# Patient Record
Sex: Female | Born: 1980 | Race: White | Hispanic: No | Marital: Single | Smoking: Former smoker
Health system: Southern US, Community
[De-identification: ages and names within clinical notes are randomized; demographics above are authoritative.]

## PROBLEM LIST (undated history)

## (undated) DIAGNOSIS — C8590 Non-Hodgkin lymphoma, unspecified, unspecified site: Secondary | ICD-10-CM

---
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
## 2000-06-12 ENCOUNTER — Inpatient Hospital Stay (HOSPITAL_COMMUNITY): Admission: AD | Admit: 2000-06-12 | Discharge: 2000-06-14 | Payer: Self-pay | Admitting: Obstetrics and Gynecology

## 2001-02-25 ENCOUNTER — Other Ambulatory Visit: Admission: RE | Admit: 2001-02-25 | Discharge: 2001-02-25 | Payer: Self-pay | Admitting: Obstetrics and Gynecology

## 2002-10-21 ENCOUNTER — Other Ambulatory Visit: Admission: RE | Admit: 2002-10-21 | Discharge: 2002-10-21 | Payer: Self-pay | Admitting: Obstetrics and Gynecology

## 2002-10-22 ENCOUNTER — Other Ambulatory Visit: Admission: RE | Admit: 2002-10-22 | Discharge: 2002-10-22 | Payer: Self-pay | Admitting: Obstetrics and Gynecology

## 2005-07-03 ENCOUNTER — Other Ambulatory Visit: Admission: RE | Admit: 2005-07-03 | Discharge: 2005-07-03 | Payer: Self-pay | Admitting: Obstetrics and Gynecology

## 2006-01-15 ENCOUNTER — Inpatient Hospital Stay (HOSPITAL_COMMUNITY): Admission: AD | Admit: 2006-01-15 | Discharge: 2006-01-17 | Payer: Self-pay | Admitting: Obstetrics and Gynecology

## 2006-04-02 ENCOUNTER — Ambulatory Visit (HOSPITAL_COMMUNITY): Admission: RE | Admit: 2006-04-02 | Discharge: 2006-04-02 | Payer: Self-pay | Admitting: Obstetrics & Gynecology

## 2006-04-02 ENCOUNTER — Encounter (INDEPENDENT_AMBULATORY_CARE_PROVIDER_SITE_OTHER): Payer: Self-pay | Admitting: Specialist

## 2006-04-05 ENCOUNTER — Ambulatory Visit (HOSPITAL_COMMUNITY): Admission: RE | Admit: 2006-04-05 | Discharge: 2006-04-05 | Payer: Self-pay | Admitting: Obstetrics & Gynecology

## 2006-05-01 ENCOUNTER — Ambulatory Visit: Admission: RE | Admit: 2006-05-01 | Discharge: 2006-05-01 | Payer: Self-pay | Admitting: Gynecology

## 2006-05-28 ENCOUNTER — Inpatient Hospital Stay (HOSPITAL_COMMUNITY): Admission: RE | Admit: 2006-05-28 | Discharge: 2006-05-31 | Payer: Self-pay | Admitting: Obstetrics & Gynecology

## 2006-05-28 ENCOUNTER — Encounter: Payer: Self-pay | Admitting: Gynecology

## 2006-05-28 ENCOUNTER — Encounter (INDEPENDENT_AMBULATORY_CARE_PROVIDER_SITE_OTHER): Payer: Self-pay | Admitting: *Deleted

## 2006-05-30 ENCOUNTER — Ambulatory Visit: Payer: Self-pay | Admitting: Oncology

## 2006-05-31 ENCOUNTER — Ambulatory Visit: Payer: Self-pay | Admitting: Oncology

## 2006-06-04 ENCOUNTER — Encounter: Payer: Self-pay | Admitting: Oncology

## 2006-06-04 ENCOUNTER — Encounter (INDEPENDENT_AMBULATORY_CARE_PROVIDER_SITE_OTHER): Payer: Self-pay | Admitting: *Deleted

## 2006-06-04 ENCOUNTER — Other Ambulatory Visit: Admission: RE | Admit: 2006-06-04 | Discharge: 2006-06-04 | Payer: Self-pay | Admitting: Oncology

## 2006-06-04 LAB — CBC WITH DIFFERENTIAL/PLATELET
Eosinophils Absolute: 0.2 10*3/uL (ref 0.0–0.5)
LYMPH%: 19.4 % (ref 14.0–48.0)
MCH: 30.7 pg (ref 26.0–34.0)
MCHC: 34.5 g/dL (ref 32.0–36.0)
MCV: 89.1 fL (ref 81.0–101.0)
MONO#: 0.2 10*3/uL (ref 0.1–0.9)
MONO%: 3.7 % (ref 0.0–13.0)
NEUT%: 73.8 % (ref 39.6–76.8)
RBC: 4.42 10*6/uL (ref 3.70–5.32)
RDW: 12.4 % (ref 11.3–14.5)
lymph#: 1.2 10*3/uL (ref 0.9–3.3)

## 2006-06-04 LAB — COMPREHENSIVE METABOLIC PANEL
ALT: 12 U/L (ref 0–35)
AST: 10 U/L (ref 0–37)
Albumin: 4.7 g/dL (ref 3.5–5.2)
Creatinine, Ser: 0.55 mg/dL (ref 0.40–1.20)
Glucose, Bld: 98 mg/dL (ref 70–99)
Potassium: 4.2 mEq/L (ref 3.5–5.3)
Total Protein: 7.2 g/dL (ref 6.0–8.3)

## 2006-06-04 LAB — LACTATE DEHYDROGENASE: LDH: 138 U/L (ref 94–250)

## 2006-06-07 LAB — HEPATITIS B SURFACE ANTIGEN: Hepatitis B Surface Ag: NEGATIVE

## 2006-06-10 ENCOUNTER — Ambulatory Visit (HOSPITAL_COMMUNITY): Admission: RE | Admit: 2006-06-10 | Discharge: 2006-06-10 | Payer: Self-pay | Admitting: Oncology

## 2006-06-21 LAB — CBC WITH DIFFERENTIAL/PLATELET
EOS%: 0.9 % (ref 0.0–7.0)
HGB: 12.5 g/dL (ref 11.6–15.9)
MCH: 30.8 pg (ref 26.0–34.0)
MCHC: 36 g/dL (ref 32.0–36.0)
MCV: 85.3 fL (ref 81.0–101.0)
MONO#: 0 10*3/uL — ABNORMAL LOW (ref 0.1–0.9)
NEUT#: 2.7 10*3/uL (ref 1.5–6.5)
NEUT%: 63.5 % (ref 39.6–76.8)
lymph#: 1.5 10*3/uL (ref 0.9–3.3)

## 2006-06-27 ENCOUNTER — Encounter (INDEPENDENT_AMBULATORY_CARE_PROVIDER_SITE_OTHER): Payer: Self-pay | Admitting: Cardiology

## 2006-06-27 ENCOUNTER — Ambulatory Visit: Admission: RE | Admit: 2006-06-27 | Discharge: 2006-06-27 | Payer: Self-pay | Admitting: Oncology

## 2006-07-02 LAB — CBC WITH DIFFERENTIAL/PLATELET
HCT: 36.4 % (ref 34.8–46.6)
LYMPH%: 34.6 % (ref 14.0–48.0)
MCH: 30.8 pg (ref 26.0–34.0)
MCV: 84.6 fL (ref 81.0–101.0)
MONO#: 0.6 10*3/uL (ref 0.1–0.9)
NEUT#: 0.9 10*3/uL — ABNORMAL LOW (ref 1.5–6.5)
NEUT%: 38.7 % — ABNORMAL LOW (ref 39.6–76.8)
Platelets: 239 10*3/uL (ref 145–400)
RBC: 4.31 10*6/uL (ref 3.70–5.32)
WBC: 2.2 10*3/uL — ABNORMAL LOW (ref 3.9–10.0)
lymph#: 0.8 10*3/uL — ABNORMAL LOW (ref 0.9–3.3)

## 2006-07-02 LAB — TECHNOLOGIST REVIEW

## 2006-07-05 LAB — COMPREHENSIVE METABOLIC PANEL
ALT: 15 U/L (ref 0–35)
CO2: 26 mEq/L (ref 19–32)
Calcium: 8.9 mg/dL (ref 8.4–10.5)
Chloride: 107 mEq/L (ref 96–112)
Potassium: 3.8 mEq/L (ref 3.5–5.3)
Sodium: 138 mEq/L (ref 135–145)
Total Protein: 6.9 g/dL (ref 6.0–8.3)

## 2006-07-05 LAB — CBC WITH DIFFERENTIAL/PLATELET
BASO%: 0.9 % (ref 0.0–2.0)
HCT: 36.7 % (ref 34.8–46.6)
MCHC: 35.6 g/dL (ref 32.0–36.0)
MONO#: 0.4 10*3/uL (ref 0.1–0.9)
RBC: 4.31 10*6/uL (ref 3.70–5.32)
RDW: 11.7 % (ref 11.3–14.5)
WBC: 5.2 10*3/uL (ref 3.9–10.0)
lymph#: 1.4 10*3/uL (ref 0.9–3.3)

## 2006-07-09 ENCOUNTER — Ambulatory Visit: Admission: RE | Admit: 2006-07-09 | Discharge: 2006-07-09 | Payer: Self-pay | Admitting: Gynecology

## 2006-07-15 ENCOUNTER — Ambulatory Visit: Payer: Self-pay | Admitting: Oncology

## 2006-07-15 LAB — CBC WITH DIFFERENTIAL/PLATELET
BASO%: 0.9 % (ref 0.0–2.0)
EOS%: 0.7 % (ref 0.0–7.0)
LYMPH%: 13.7 % — ABNORMAL LOW (ref 14.0–48.0)
MCH: 30.5 pg (ref 26.0–34.0)
MCHC: 35.8 g/dL (ref 32.0–36.0)
MONO#: 0.2 10*3/uL (ref 0.1–0.9)
RBC: 3.86 10*6/uL (ref 3.70–5.32)
WBC: 3.6 10*3/uL — ABNORMAL LOW (ref 3.9–10.0)
lymph#: 0.5 10*3/uL — ABNORMAL LOW (ref 0.9–3.3)

## 2006-07-23 LAB — CBC WITH DIFFERENTIAL/PLATELET
BASO%: 0.8 % (ref 0.0–2.0)
EOS%: 1.1 % (ref 0.0–7.0)
HCT: 33.2 % — ABNORMAL LOW (ref 34.8–46.6)
MCHC: 36 g/dL (ref 32.0–36.0)
MONO#: 0.7 10*3/uL (ref 0.1–0.9)
NEUT%: 53.7 % (ref 39.6–76.8)
RDW: 18 % — ABNORMAL HIGH (ref 11.3–14.5)
WBC: 3.8 10*3/uL — ABNORMAL LOW (ref 3.9–10.0)
lymph#: 1 10*3/uL (ref 0.9–3.3)

## 2006-07-26 LAB — CBC WITH DIFFERENTIAL/PLATELET
Basophils Absolute: 0.1 10*3/uL (ref 0.0–0.1)
EOS%: 0.5 % (ref 0.0–7.0)
HCT: 35.5 % (ref 34.8–46.6)
HGB: 12.7 g/dL (ref 11.6–15.9)
LYMPH%: 17.8 % (ref 14.0–48.0)
MCH: 31 pg (ref 26.0–34.0)
MONO#: 0.7 10*3/uL (ref 0.1–0.9)
NEUT%: 68.6 % (ref 39.6–76.8)
Platelets: 281 10*3/uL (ref 145–400)
lymph#: 1.2 10*3/uL (ref 0.9–3.3)

## 2006-08-16 LAB — CBC WITH DIFFERENTIAL/PLATELET
BASO%: 0.5 % (ref 0.0–2.0)
Basophils Absolute: 0 10*3/uL (ref 0.0–0.1)
Eosinophils Absolute: 0 10*3/uL (ref 0.0–0.5)
HCT: 35.6 % (ref 34.8–46.6)
HGB: 12.8 g/dL (ref 11.6–15.9)
MONO#: 0.8 10*3/uL (ref 0.1–0.9)
NEUT#: 7.3 10*3/uL — ABNORMAL HIGH (ref 1.5–6.5)
NEUT%: 79 % — ABNORMAL HIGH (ref 39.6–76.8)
WBC: 9.2 10*3/uL (ref 3.9–10.0)
lymph#: 1 10*3/uL (ref 0.9–3.3)

## 2006-08-16 LAB — COMPREHENSIVE METABOLIC PANEL
BUN: 8 mg/dL (ref 6–23)
CO2: 26 mEq/L (ref 19–32)
Calcium: 9.4 mg/dL (ref 8.4–10.5)
Chloride: 106 mEq/L (ref 96–112)
Creatinine, Ser: 0.42 mg/dL (ref 0.40–1.20)
Total Bilirubin: 0.7 mg/dL (ref 0.3–1.2)

## 2006-08-26 ENCOUNTER — Ambulatory Visit (HOSPITAL_COMMUNITY): Admission: RE | Admit: 2006-08-26 | Discharge: 2006-08-26 | Payer: Self-pay | Admitting: Oncology

## 2006-08-29 ENCOUNTER — Ambulatory Visit (HOSPITAL_COMMUNITY): Admission: RE | Admit: 2006-08-29 | Discharge: 2006-08-29 | Payer: Self-pay | Admitting: Oncology

## 2006-08-29 ENCOUNTER — Ambulatory Visit: Payer: Self-pay | Admitting: Oncology

## 2006-08-31 LAB — PNEUMOCYSTIS JIROVECI SMEAR BY DFA

## 2006-09-06 LAB — COMPREHENSIVE METABOLIC PANEL
ALT: 16 U/L (ref 0–35)
AST: 21 U/L (ref 0–37)
Albumin: 3.8 g/dL (ref 3.5–5.2)
Alkaline Phosphatase: 65 U/L (ref 39–117)
Calcium: 8.7 mg/dL (ref 8.4–10.5)
Chloride: 107 mEq/L (ref 96–112)
Potassium: 3.8 mEq/L (ref 3.5–5.3)
Sodium: 140 mEq/L (ref 135–145)

## 2006-09-06 LAB — CBC WITH DIFFERENTIAL/PLATELET
BASO%: 1.8 % (ref 0.0–2.0)
EOS%: 1.3 % (ref 0.0–7.0)
HGB: 11.8 g/dL (ref 11.6–15.9)
MCH: 31.2 pg (ref 26.0–34.0)
MCHC: 34.5 g/dL (ref 32.0–36.0)
MCV: 90.5 fL (ref 81.0–101.0)
MONO%: 14 % — ABNORMAL HIGH (ref 0.0–13.0)
RBC: 3.77 10*6/uL (ref 3.70–5.32)
RDW: 14.1 % (ref 11.3–14.5)
lymph#: 0.8 10*3/uL — ABNORMAL LOW (ref 0.9–3.3)

## 2006-09-27 LAB — COMPREHENSIVE METABOLIC PANEL
ALT: 13 U/L (ref 0–35)
AST: 14 U/L (ref 0–37)
Alkaline Phosphatase: 66 U/L (ref 39–117)
BUN: 12 mg/dL (ref 6–23)
Chloride: 104 mEq/L (ref 96–112)
Creatinine, Ser: 0.51 mg/dL (ref 0.40–1.20)

## 2006-09-27 LAB — CBC WITH DIFFERENTIAL/PLATELET
BASO%: 1.2 % (ref 0.0–2.0)
Basophils Absolute: 0.1 10*3/uL (ref 0.0–0.1)
EOS%: 1.1 % (ref 0.0–7.0)
HCT: 34.4 % — ABNORMAL LOW (ref 34.8–46.6)
HGB: 12.6 g/dL (ref 11.6–15.9)
MCH: 32 pg (ref 26.0–34.0)
MCHC: 36.5 g/dL — ABNORMAL HIGH (ref 32.0–36.0)
MCV: 87.7 fL (ref 81.0–101.0)
MONO%: 14.7 % — ABNORMAL HIGH (ref 0.0–13.0)
NEUT%: 66.5 % (ref 39.6–76.8)
RDW: 12.2 % (ref 11.3–14.5)
lymph#: 0.7 10*3/uL — ABNORMAL LOW (ref 0.9–3.3)

## 2006-10-21 ENCOUNTER — Ambulatory Visit (HOSPITAL_COMMUNITY): Admission: RE | Admit: 2006-10-21 | Discharge: 2006-10-21 | Payer: Self-pay | Admitting: Oncology

## 2006-10-22 ENCOUNTER — Ambulatory Visit: Payer: Self-pay | Admitting: Oncology

## 2006-10-24 LAB — CBC WITH DIFFERENTIAL/PLATELET
BASO%: 0.7 % (ref 0.0–2.0)
Basophils Absolute: 0.1 10*3/uL (ref 0.0–0.1)
EOS%: 0.9 % (ref 0.0–7.0)
HCT: 36.5 % (ref 34.8–46.6)
HGB: 13.3 g/dL (ref 11.6–15.9)
LYMPH%: 9.3 % — ABNORMAL LOW (ref 14.0–48.0)
MCH: 31.8 pg (ref 26.0–34.0)
MCHC: 36.5 g/dL — ABNORMAL HIGH (ref 32.0–36.0)
MCV: 87.1 fL (ref 81.0–101.0)
MONO%: 9.1 % (ref 0.0–13.0)
NEUT%: 80 % — ABNORMAL HIGH (ref 39.6–76.8)
Platelets: 239 10*3/uL (ref 145–400)

## 2006-11-21 LAB — CBC WITH DIFFERENTIAL/PLATELET
Basophils Absolute: 0 10*3/uL (ref 0.0–0.1)
EOS%: 2.4 % (ref 0.0–7.0)
HCT: 37.1 % (ref 34.8–46.6)
HGB: 13.5 g/dL (ref 11.6–15.9)
MONO#: 0.5 10*3/uL (ref 0.1–0.9)
NEUT#: 3.5 10*3/uL (ref 1.5–6.5)
NEUT%: 73.1 % (ref 39.6–76.8)
RDW: 11.2 % — ABNORMAL LOW (ref 11.3–14.5)
WBC: 4.8 10*3/uL (ref 3.9–10.0)
lymph#: 0.7 10*3/uL — ABNORMAL LOW (ref 0.9–3.3)

## 2007-01-15 ENCOUNTER — Ambulatory Visit (HOSPITAL_COMMUNITY): Admission: RE | Admit: 2007-01-15 | Discharge: 2007-01-15 | Payer: Self-pay | Admitting: Oncology

## 2007-01-28 ENCOUNTER — Ambulatory Visit: Payer: Self-pay | Admitting: Oncology

## 2007-04-16 ENCOUNTER — Ambulatory Visit: Payer: Self-pay | Admitting: Oncology

## 2007-05-30 ENCOUNTER — Ambulatory Visit: Payer: Self-pay | Admitting: Oncology

## 2007-06-03 LAB — CBC WITH DIFFERENTIAL/PLATELET
Basophils Absolute: 0.1 10*3/uL (ref 0.0–0.1)
EOS%: 1.2 % (ref 0.0–7.0)
Eosinophils Absolute: 0.1 10*3/uL (ref 0.0–0.5)
HCT: 37.9 % (ref 34.8–46.6)
HGB: 13.7 g/dL (ref 11.6–15.9)
MCH: 31.4 pg (ref 26.0–34.0)
MCV: 86.8 fL (ref 81.0–101.0)
MONO%: 6.8 % (ref 0.0–13.0)
NEUT#: 5 10*3/uL (ref 1.5–6.5)
NEUT%: 77 % — ABNORMAL HIGH (ref 39.6–76.8)
RDW: 10.7 % — ABNORMAL LOW (ref 11.3–14.5)
lymph#: 0.9 10*3/uL (ref 0.9–3.3)

## 2007-09-30 ENCOUNTER — Ambulatory Visit: Payer: Self-pay | Admitting: Oncology

## 2008-01-27 ENCOUNTER — Ambulatory Visit: Payer: Self-pay | Admitting: Oncology

## 2008-06-01 IMAGING — CT NM PET TUM IMG SKULL BASE T - THIGH
5 series · 25 of 25 positions shown · IV contrast (REDICAT)
Comparison: No prior PET studies for comparison.  Comparison is made to CT of the chest and abdomen on 05/29/06.

CLINICAL DATA: New diagnosis of diffuse large B cell lymphoma. The diagnosis was made from resection of a right pelvic retroperitoneal mass.  Staging CT has also shown evidence of right-sided axillary and subpectoral adenopathy as well as a 1.2 cm left adrenal nodule, splenomegaly, and soft tissue thickening along the medial wall of the cecum.  
FDG PET-CT TUMOR IMAGING (SKULL BASE TO THIGHS) ? 06/10/06: 
Fasting Blood Glucose:  89.
TECHNIQUE: 15.6 mCi F18-FDG were administered via the left antecubital fossa.  Full ring PET imaging was performed from the skull base through the mid-thighs 60   minutes after injection.  CT data was obtained and used for attenuation correction and anatomic localization only.  (This was not acquired as a diagnostic CT examination.)

[Series 1: pet ac · axial · 3.3mm · 4.69mm/px · z∈[-734,-8]mm · 5 of 223 slices shown]
[im 1/223]
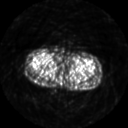
[im 56/223]
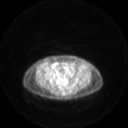
[im 112/223]
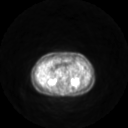
[im 167/223]
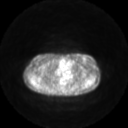
[im 223/223]
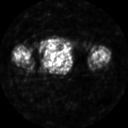

[Series 2: pet nac · axial · 3.3mm · 4.69mm/px · z∈[-734,-8]mm · 6 of 223 slices shown]
[im 1/223]
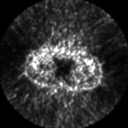
[im 45/223]
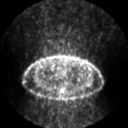
[im 89/223]
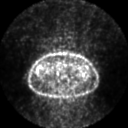
[im 134/223]
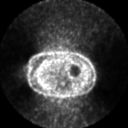
[im 178/223]
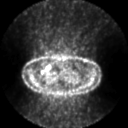
[im 223/223]
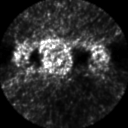

[Series 2: ct images · axial · 3.8mm · 0.98mm/px · z∈[-734,-8]mm · 6 of 223 slices shown]
[im 1/223]
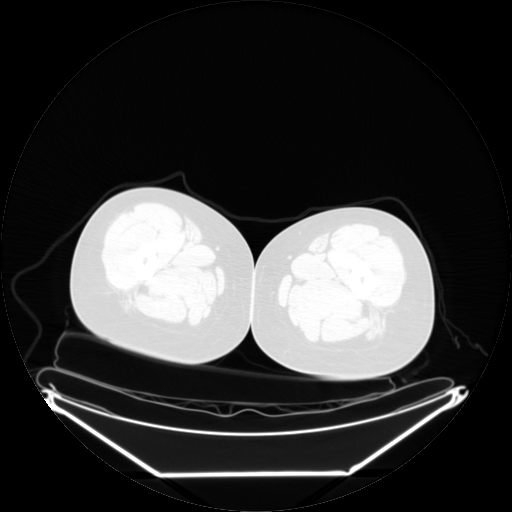
[im 45/223]
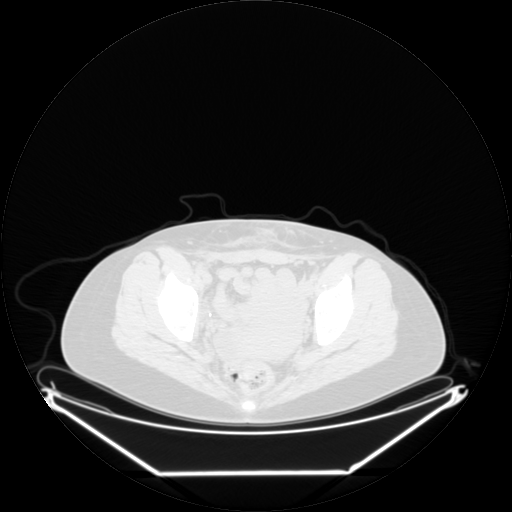
[im 89/223]
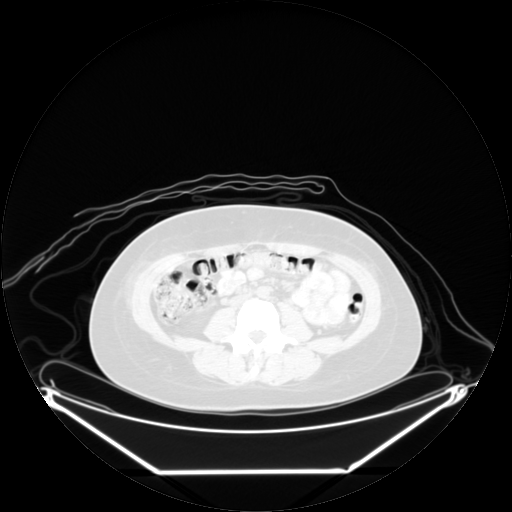
[im 134/223]
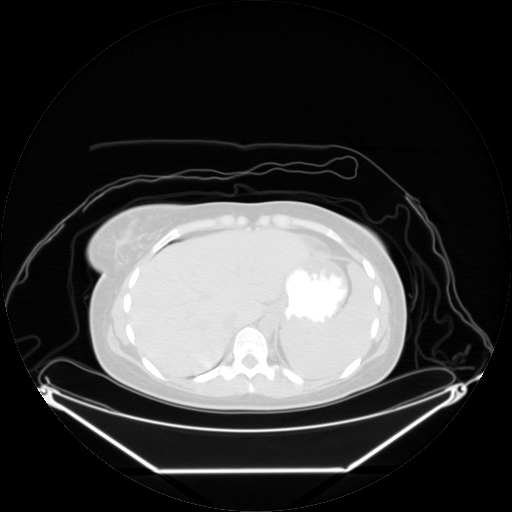
[im 178/223]
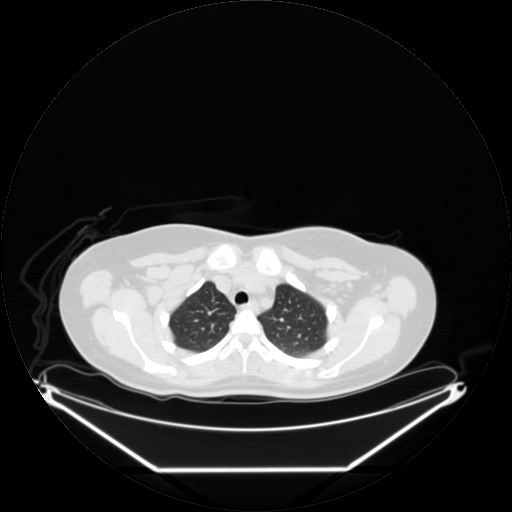
[im 223/223]
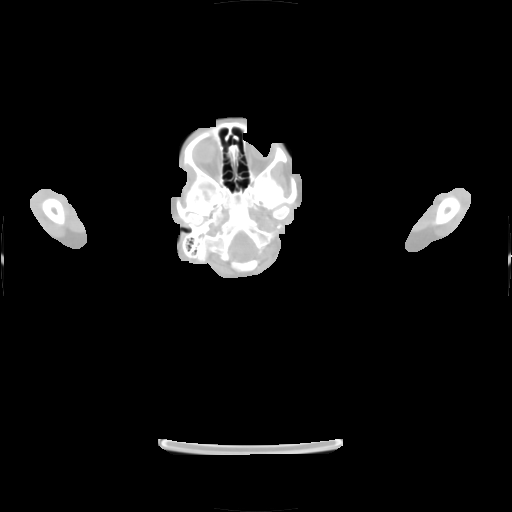

[Series 151: reformatted · axial · 3.3mm · 3.91mm/px · z∈[-731,-8]mm · 6 of 220 slices shown (1 of 2)]
[im 1/220]
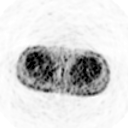
[im 44/220]
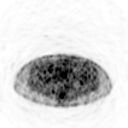
[im 88/220]
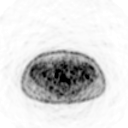
[im 132/220]
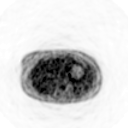
[im 176/220]
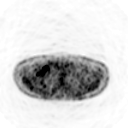
[im 220/220]
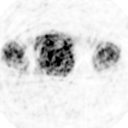

[Series 153: reformatted · coronal · 3.3mm · 5.83mm/px · 2 of 63 slices shown (2 of 2)]
[im 1/63]
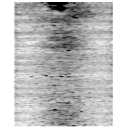
[im 63/63]
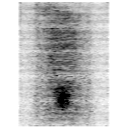

[25 of 25 positions shown; findings below may reference images not displayed]

FINDINGS: The large subpectoral node on the right shows abnormal metabolic activity with malignant-level SUV-max of 4.0.  Maximal transverse dimensions obtained on the attenuation correction CT images is 1.8 x 3 cm.  An adjacent more lateral subpectoral lymph node which is actually located in the high axilla measures 1.6 x 3.6 cm. This also demonstrates abnormal metabolic activity with an SUV-max of 3.8.  A tiny subpectoral lymph node and adjacent small axillary nodes on the left show no abnormal metabolic activity.  No other enlarged lymph nodes are identified in the chest.  No abnormal bone marrow activity.  No pleural fluid.  
There is nonspecific activity in the right tonsillar region and adjacent to the angle of the mandible on the right.  It is not accompanied by a definite soft tissue mass and likely relates to motion and muscle activity occurring just prior to the PET acquisition.  
At the level of the resected right-sided pelvic extraperitoneal tumor mass, clips are seen from prior surgical excision.  No abnormal metabolic activity is seen in this region.  No other lymph node activity is seen in the abdomen or pelvis.  There is a focal right adnexal cyst in the pelvis measuring approximately 3.4 cm and demonstrating no abnormal metabolic activity.  No bone marrow activity or focal bony lesions are identified.
IMPRESSION: The only definite abnormal lymph node activity present by PET is in the right axillary and subpectoral regions at the site of previously imaged enlarged lymph nodes.  There is no abnormal metabolic activity present in the right lateral pelvis at the site of resection of the large extraperitoneal soft tissue mass or of the left adrenal gland.  Activity in the region of the tonsils and mandible is present which is more prominent towards the right.  This is not the location of obviously enlarged lymph node tissue and it is felt unlikely that this represents lymphoma.

## 2009-06-11 ENCOUNTER — Ambulatory Visit: Payer: Self-pay | Admitting: Interventional Radiology

## 2009-06-11 ENCOUNTER — Emergency Department (HOSPITAL_BASED_OUTPATIENT_CLINIC_OR_DEPARTMENT_OTHER): Admission: EM | Admit: 2009-06-11 | Discharge: 2009-06-11 | Payer: Self-pay | Admitting: Emergency Medicine

## 2009-07-13 ENCOUNTER — Ambulatory Visit: Payer: Self-pay | Admitting: Oncology

## 2009-08-12 ENCOUNTER — Ambulatory Visit: Payer: Self-pay | Admitting: Oncology

## 2010-02-09 ENCOUNTER — Ambulatory Visit: Payer: Self-pay | Admitting: Oncology

## 2010-03-13 ENCOUNTER — Ambulatory Visit: Payer: Self-pay | Admitting: Oncology

## 2010-06-11 ENCOUNTER — Encounter: Payer: Self-pay | Admitting: Obstetrics & Gynecology

## 2010-08-07 LAB — DIFFERENTIAL
Eosinophils Absolute: 0.1 10*3/uL (ref 0.0–0.7)
Eosinophils Relative: 1 % (ref 0–5)
Lymphocytes Relative: 12 % (ref 12–46)
Monocytes Absolute: 0.6 10*3/uL (ref 0.1–1.0)
Neutro Abs: 7.9 10*3/uL — ABNORMAL HIGH (ref 1.7–7.7)
Neutrophils Relative %: 78 % — ABNORMAL HIGH (ref 43–77)

## 2010-08-07 LAB — CBC
HCT: 41.6 % (ref 36.0–46.0)
Hemoglobin: 14.3 g/dL (ref 12.0–15.0)
MCHC: 34.2 g/dL (ref 30.0–36.0)
MCV: 91.5 fL (ref 78.0–100.0)
Platelets: 213 10*3/uL (ref 150–400)
RDW: 12 % (ref 11.5–15.5)

## 2010-08-22 ENCOUNTER — Other Ambulatory Visit: Payer: Self-pay | Admitting: Obstetrics and Gynecology

## 2010-10-03 NOTE — Discharge Summary (Signed)
NAMEAYJAH, SHOW NO.:  [id number]   MEDICAL RECORD NO.:  [id number]          PATIENT TYPE:  INP   LOCATION:  1609                         FACILITY:  [facility]   PHYSICIAN:  Ilda Mori, M.D.   DATE OF BIRTH:  26-Nov-1980   DATE OF ADMISSION:  05/28/2006  DATE OF DISCHARGE:  05/31/2006                               DISCHARGE SUMMARY   FINAL DIAGNOSIS:  Large B-cell lymphoma in the right retroperitoneal  pelvic area.   SECONDARY DIAGNOSIS:  None.   PROCEDURE:  Retroperitoneal dissection of a mass consistent with large B  cell lymphoma.   COMPLICATIONS:  None.   CONDITION ON DISCHARGE:  Stable.   This is a 30 year old female who was found to have a right pelvic mass  on a postpartum visit to the office.  On ultrasound, the mass was  thought to represent a solid ovarian tumor.  A diagnostic laparoscopy  was performed where the tumor was found to be retroperitoneal and  pushing the iliac vessels anteriorly and medially.  Because of the  location of the tumor on laparoscopy and the fact that it was beneath  the major blood vessels, the decision was made to refer to GYN oncology  for surgical resection.  The patient was admitted on January 8 and the  surgical resection was performed by Dr. De Blanch with the  assistance of Dr. Ilda Mori of a 7 x 5 cm mass which was closely  associated with the iliac artery and vein, the obturator nerve, and the  external iliac vein and ureter.  Dissection was successfully performed.  Frozen section revealed a malignancy, possible lymphoma.  The patient's  postoperative course was benign without significant fever or anemia.  On  the third postoperative day, she was felt to be ready for discharge.  She was discharged on a regular diet.  Told to limit her activity.  She  was given oxycodone and acetaminophen for pain and asked to return to  the office in 4 weeks for follow-up evaluation.  She was also seen by  Oncology during that hospitalization, Dr. Mancel Bale, and her plans  outpatient were to be staged with a PET scan and to be started on  chemotherapy and to be followed closely by the oncologist.   LABORATORY DATA:  Her pathology report revealed diffuse large cell  lymphoma that was high grade.  Other laboratory data:  An admission  hemoglobin of 12.5 with a white count of 6,000.  Postoperatively her  hemoglobin is 11.3 with a white count is 6,000.  Her complete metabolic  profile had no abnormalities.      Ilda Mori, M.D.  Electronically Signed     RK/MEDQ  D:  10/24/2006  T:  10/24/2006  Job:  [id number]   cc:   De Blanch, M.D.  [address]  [place]  [place] [address]   Leighton Roach Truett Perna, M.D.  Fax: [phone]

## 2010-10-06 NOTE — Op Note (Signed)
NAMEYAELI, HARTUNG                  ACCOUNT NO.:  [id number]   MEDICAL RECORD NO.:  [id number]          PATIENT TYPE:  INP   LOCATION:  X005                         FACILITY:  [facility]   PHYSICIAN:  De Blanch, M.D.DATE OF BIRTH:  06-15-1980   DATE OF PROCEDURE:  05/28/2006  DATE OF DISCHARGE:                               OPERATIVE REPORT   PREOPERATIVE DIAGNOSIS:  Right retroperitoneal pelvic mass.   POSTOPERATIVE DIAGNOSIS:  Retroperitoneal malignancy, possible lymphoma.   PROCEDURE:  Extensive resection of retroperitoneal mass, excision of  right pelvic lymph node.   SURGEON:  De Blanch, M.D.   FIRST ASSISTANT:  Ilda Mori, M.D.  Telford Nab, R.N.   ANESTHESIA:  General with orotracheal tube.   ESTIMATED BLOOD LOSS:  50 mL.   SURGICAL FINDINGS:  The patient had previously had a diagnostic  laparoscopy and this revealed a retroperitoneal mass.  Her uterus, tubes  and ovaries appeared normal, according to Dr. Arlyce Dice.  In the right  retroperitoneum was a mass measuring approximately 7 x 5 cm in diameter.  This was intimately associated with the external iliac vein and ureter,  internal iliac artery and vein, and obturator nerve.  Throughout the  dissection, care was taken to avoid injury to any of these vital  structures.  Frozen section revealed a poorly differentiated malignancy,  possibly a lymphoma.   DESCRIPTION OF PROCEDURE:  The patient was brought to the operating room  and after satisfactory attainment of general anesthesia, she was placed  in the modified lithotomy position in [place] stirrups.  The anterior  abdominal wall, vagina, and perineum were prepped with Betadine, a Foley  catheter was inserted, and the patient was draped.  The abdomen was  entered through a Pfannenstiel incision to the level of incising the  fascia.  We then proceeded lateral to the right rectus muscle entering  the retroperitoneal space.  The psoas muscle and  external iliac artery  and vein were initially identified.  The dissection was carried medially  where the ureter was found crossing the hypogastric artery.  The  perirectal space was opened.  The paravesical space was, likewise,  opened.  A large mass occupying the pelvic sidewall.  Careful dissection  was then performed mobilizing the external iliac artery away from the  mass.  Using a vein retractor, the vein was held laterally and the mass  was dissected off the psoas muscle laterally.  Medially, the mass was  dissected away from the ureter and hypogastric artery and superior  vesical artery.  The paravesical space was then opened.  Hemostasis was  achieved with cautery and hemoclips.  The dissection was then carried  along the pubic symphysis until the obturator fossa was identified and  the obturator nerve was then found as it entered the obturator canal.  The nerve immediately beneath the mass and using blunt and sharp  dissection, the nerve was mobilized away from the mass.  The obturator  vein was clipped to achieve hemostasis.  The nerve was further dissected  cephalad until it was freed totally away from the mass.  The dissection  was then carried along the hypogastric vein with careful dissection to  avoid injury to the vein until the mass was entirely excised.  It was  submitted to pathology for frozen section with the above noted findings.  The pelvic sidewall was then irrigated and hemostasis achieved and  ascertained.  A 15 Blake drain was placed in the retroperitoneal space  and exited through a stab incision in the right lower abdominal wall.  The fascia was then closed with a running suture of 0 of Vicryl.  The  subcutaneous tissue was irrigated.  Hemostasis was achieved with  cautery.  The skin was closed with a running subcuticular suture of 5-0  Vicryl.  A dressing was applied.  The patient was awakened from  anesthesia and taken to the recovery room in satisfactory  condition.  Sponge, needle and instrument counts were correct x2.      De Blanch, M.D.  Electronically Signed     DC/MEDQ  D:  05/28/2006  T:  05/28/2006  Job:  [id number]   cc:   Ilda Mori, M.D.  Fax: [phone]   Telford Nab, R.N.  [address]  [place], [place] [address]

## 2010-10-06 NOTE — Consult Note (Signed)
Elizabeth Rosales, Elizabeth Rosales                  ACCOUNT NO.:  [id number]   MEDICAL RECORD NO.:  [id number]          PATIENT TYPE:  INP   LOCATION:  1609                         FACILITY:  [facility]   PHYSICIAN:  De Blanch, M.D.DATE OF BIRTH:  Sep 29, 1980   DATE OF CONSULTATION:  07/09/2006  DATE OF DISCHARGE:  05/31/2006                                 CONSULTATION   GYN ONCOLOGY CLINIC   DATE OF SERVICE:  July 09, 2006   CHIEF COMPLAINT:  Postoperative followup.   SUBJECTIVE:  The patient returns today, now approximately six weeks  following resection of a retroperitoneal right pelvic mass.  This was  found to be a diffuse large B-cell lymphoma.  She has undergone imaging  and staging and is currently receiving chemotherapy under the direction  of Dr. Mancel Bale and seems to be tolerating it well.   From a surgical point of view she has done well.  She denies any GI or  GU symptoms and has no pelvic pain, pressure, vaginal or urethral  discharge.   PHYSICAL EXAMINATION:  VITAL SIGNS:  Weight 133 pounds.  Blood pressure  103/70.  ABDOMEN:  Soft and nontender.  Pfannenstiel incision is healing well.  PELVIC:  EG, BUS, vagina, bladder, urethra are normal.  Cervix is  normal.  Uterus is anterior, normal shape, size and consistency.  There  are no adnexal masses noted.   IMPRESSION:  Large B-cell lymphoma which presented as a pelvic mass.  Patient will continue under the care of Dr. Mancel Bale.  The patient  is given the okay to return to full levels of activity and from a  surgical point of view I think she is healing nicely.  She will return  to the care of Dr. Arlyce Dice for further gynecologic management.      De Blanch, M.D.  Electronically Signed     DC/MEDQ  D:  07/09/2006  T:  07/10/2006  Job:  [id number]   cc:   Ilda Mori, M.D.  Fax: [phone]   Telford Nab, R.N.  [address]  [place], [place] [address]

## 2012-03-10 ENCOUNTER — Telehealth: Payer: Self-pay | Admitting: Oncology

## 2012-03-10 NOTE — Telephone Encounter (Signed)
Pt called and r/s missed appt on June 2012 with ML

## 2012-03-19 ENCOUNTER — Ambulatory Visit (HOSPITAL_BASED_OUTPATIENT_CLINIC_OR_DEPARTMENT_OTHER): Payer: 59 | Admitting: Lab

## 2012-03-19 ENCOUNTER — Ambulatory Visit (HOSPITAL_BASED_OUTPATIENT_CLINIC_OR_DEPARTMENT_OTHER): Payer: 59 | Admitting: Nurse Practitioner

## 2012-03-19 ENCOUNTER — Telehealth: Payer: Self-pay | Admitting: Oncology

## 2012-03-19 VITALS — BP 106/74 | HR 97 | Temp 98.1°F | Resp 20 | Ht 61.0 in | Wt 137.0 lb

## 2012-03-19 DIAGNOSIS — L659 Nonscarring hair loss, unspecified: Secondary | ICD-10-CM

## 2012-03-19 DIAGNOSIS — R635 Abnormal weight gain: Secondary | ICD-10-CM

## 2012-03-19 DIAGNOSIS — C8589 Other specified types of non-Hodgkin lymphoma, extranodal and solid organ sites: Secondary | ICD-10-CM

## 2012-03-19 DIAGNOSIS — L989 Disorder of the skin and subcutaneous tissue, unspecified: Secondary | ICD-10-CM

## 2012-03-19 NOTE — Progress Notes (Signed)
OFFICE PROGRESS NOTE  Interval history:  [name] returns after missing a followup visit in June 2012. To review, she is a 31 year old woman diagnosed with non-Hodgkin's lymphoma in January 2008. She completed treatment with CHOP/Rituxan in May 2008.  She reports overall she feels well. She has a good energy level. No fevers or sweats. She has a good appetite. She reports recent weight gain despite exercising and dieting. She has also noted some hair loss. She denies pain. No shortness of breath or cough. No bowel or bladder problems. A skin lesion at the right upper thigh has recently enlarged in size and has an area of discoloration.   Objective: Blood pressure 106/74, pulse 97, temperature 98.1 F (36.7 C), temperature source Oral, resp. rate 20, height 5\' 1"  (1.549 m), weight 137 lb (62.143 kg).  Oropharynx is without thrush or ulceration. 2 small, pea-sized left posterior cervical nodes. Question small pea-sized right low posterior cervical node. No other cervical, supraclavicular, axillary or inguinal lymph nodes. Lungs are clear. Regular cardiac rhythm. Abdomen is soft and nontender. No organomegaly. Extremities are without edema. Calves are soft and nontender. At the right upper medial thigh there is a soft fleshy skin lesion measuring approximately 3 mm that is partially hyperpigmented.  Lab Results: Lab Results  Component Value Date   WBC 10.2 06/11/2009   HGB 14.3 06/11/2009   HCT 41.6 06/11/2009   MCV 91.5 06/11/2009   PLT 213 06/11/2009    Chemistry:    Chemistry      Component Value Date/Time   NA 136 09/27/2006 0931   K 3.8 09/27/2006 0931   CL 104 09/27/2006 0931   CO2 25 09/27/2006 0931   BUN 12 09/27/2006 0931   CREATININE 0.51 09/27/2006 0931      Component Value Date/Time   CALCIUM 9.3 09/27/2006 0931   ALKPHOS 66 09/27/2006 0931   AST 14 09/27/2006 0931   ALT 13 09/27/2006 0931   BILITOT 0.4 09/27/2006 0931       Studies/Results: No results found.  Medications: I have  reviewed the patient's current medications.  Assessment/Plan:  1. Non-Hodgkin lymphoma diagnosed January 2008, status post treatment with CHOP/Rituxan completed in 09/2006.  She remains in clinical remission. 2. Prominent thymus on a chest CT 01/15/2007. 3. History of vincristine neuropathy, resolved. 4. Left abdominal skin lesion status post a biopsy 11/07/2007, with the pathology showing a compound melanocytic nevus with atypical features.  She reports undergoing a re-excision biopsy of this area. 5. Pneumonia on a CT/PET 08/2005, resolved on a repeat CT 10/21/2006. 6. Small neck lymph nodes, stable and likely benign. 7. Subcutaneous nodular skin lesion located below the left clavicle 10/08/2007, not apparent on exam 08/12/2009.  8. Skin lesion right upper medial thigh. We are making a referral to [facility] dermatology. 9. Hair loss, weight gain. We will check a TSH. 10. Disposition.  Elizabeth Rosales remains in clinical remission from the non-Hodgkins lymphoma.  She will return for an office visit in one year. She will contact the office in the interim with any problems.  Lonna Cobb ANP/GNP-BC

## 2012-03-19 NOTE — Telephone Encounter (Signed)
Gave patient appt for October 2014 MD only, pt sent to lab today

## 2012-04-02 ENCOUNTER — Telehealth: Payer: Self-pay | Admitting: *Deleted

## 2012-04-02 NOTE — Telephone Encounter (Signed)
Attempted to reach patient to f/u on dermatology referral. Did not answer phone and mailbox was full.

## 2012-04-03 ENCOUNTER — Telehealth: Payer: Self-pay | Admitting: Oncology

## 2012-04-03 NOTE — Telephone Encounter (Signed)
Pt aware of appt with dermatologist Dr.Lupton on 04/25/12 2:25pm, gave referral to HIM today

## 2013-03-19 ENCOUNTER — Ambulatory Visit (HOSPITAL_BASED_OUTPATIENT_CLINIC_OR_DEPARTMENT_OTHER): Payer: 59 | Admitting: Oncology

## 2013-03-19 VITALS — BP 111/75 | HR 80 | Temp 98.3°F | Resp 18 | Ht 61.0 in | Wt 155.0 lb

## 2013-03-19 DIAGNOSIS — C8589 Other specified types of non-Hodgkin lymphoma, extranodal and solid organ sites: Secondary | ICD-10-CM

## 2013-03-19 DIAGNOSIS — L989 Disorder of the skin and subcutaneous tissue, unspecified: Secondary | ICD-10-CM

## 2013-03-19 NOTE — Progress Notes (Signed)
   [facility]    OFFICE PROGRESS NOTE   INTERVAL HISTORY:   She returns for scheduled followup of non-Hodgkin's lymphoma. She feels well. No fever, night sweats, anorexia, or palpable lymph nodes. A small node in the left neck has resolved.  Objective:  Vital signs in last 24 hours:  Blood pressure 111/75, pulse 80, temperature 98.3 F (36.8 C), temperature source Oral, resp. rate 18, height 5\' 1"  (1.549 m), weight 155 lb (70.308 kg), SpO2 100.00%.    HEENT: Neck without mass, prominent bilateral tonsils , oropharynx without visible mass Lymphatics: No cervical, supra-clavicular, axillary, or inguinal nodes Resp: Lungs clear bilaterally Cardio: Regular rate and rhythm GI: No hepatosplenomegaly Vascular: No leg edema  Skin: 2-3 mm hyperpigmented skin tag at the right upper thigh   Medications: I have reviewed the patient's current medications.  Assessment/Plan: 1. Non-Hodgkin lymphoma diagnosed January 2008, status post treatment with CHOP/Rituxan completed in 09/2006. She remains in clinical remission. 2. Prominent thymus on a chest CT 01/15/2007. 3. History of vincristine neuropathy, resolved. 4. Left abdominal skin lesion status post a biopsy 11/07/2007, with the pathology showing a compound melanocytic nevus with atypical features. She reports undergoing a re-excision biopsy of this area. 5. Pneumonia on a CT/PET 08/2005, resolved on a repeat CT 10/21/2006. 6. History of Small neck lymph nodes, none palpated today 7. Subcutaneous nodular skin lesion located below the left clavicle 10/08/2007, not apparent on exam 08/12/2009.  8. Skin lesion right upper medial thigh. Likely a benign skin tag, I recommended she have this lesion removed by a dermatologist  Disposition:  She remains in clinical remission from non-Hodgkin's lymphoma. She has a good prognosis for a long-term disease-free survival. Elizabeth Rosales was discharged from the oncology clinic today. She will  contact us for palpable lymph nodes or new symptoms. We will see her in the future as needed. She plans to followup with a dermatologist for removal of the right thigh lesion. She will continue medical followup with her gynecologist.   Thornton Papas, MD  03/19/2013  9:26 AM

## 2017-09-18 ENCOUNTER — Emergency Department
Admission: EM | Admit: 2017-09-18 | Discharge: 2017-09-18 | Disposition: A | Discharge status: Home / Self Care | Payer: Self-pay | Source: Home / Self Care | Attending: Emergency Medicine | Admitting: Emergency Medicine

## 2017-09-18 ENCOUNTER — Other Ambulatory Visit: Payer: Self-pay

## 2017-09-18 ENCOUNTER — Emergency Department (INDEPENDENT_AMBULATORY_CARE_PROVIDER_SITE_OTHER): Payer: Self-pay

## 2017-09-18 DIAGNOSIS — G5602 Carpal tunnel syndrome, left upper limb: Secondary | ICD-10-CM

## 2017-09-18 DIAGNOSIS — M7989 Other specified soft tissue disorders: Secondary | ICD-10-CM

## 2017-09-18 HISTORY — DX: Non-Hodgkin lymphoma, unspecified, unspecified site: C85.90

## 2017-09-18 MED ORDER — MELOXICAM 7.5 MG PO TABS: ORAL_TABLET | ORAL | 0 refills | Status: DC

## 2017-09-18 MED ORDER — GABAPENTIN 100 MG PO CAPS: ORAL_CAPSULE | ORAL | 1 refills | Status: DC

## 2017-09-18 NOTE — Discharge Instructions (Addendum)
Wear splint. Apply ice to your wrist during the day. Take medication as instructed. See Dr. Georgina Snell at 8:45 on Monday.

## 2017-09-18 NOTE — ED Provider Notes (Signed)
[facility]    CSN: [id number] Arrival date & time: 09/18/17  0813     History   Chief Complaint Chief Complaint  Patient presents with  . Wrist Pain    left    HPI Elizabeth Rosales is a 37 y.o. female.  Patient enters with a chief complaint of pain in her left wrist.  She has had some difficulty for the last year.  For the last few weeks she has had worsening pain in the wrist which is associated with numbness in the median nerve distribution.  She now has difficulty with grip strength.  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  She has been to a chiropractor for treatment and is not improving.  She wakes up at night with a throbbing in her wrist associated with numbness in her fingers.  Recently she is noted swelling over the dorsum of her wrist. HPI  Past Medical History:  Diagnosis Date  . Non Hodgkin's lymphoma ([place])     There are no active problems to display for this patient.   History reviewed. No pertinent surgical history.  OB History   None      Home Medications    Prior to Admission medications   Medication Sig Start Date End Date Taking? Authorizing Provider  gabapentin (NEURONTIN) 100 MG capsule Take 1 to 2 capsules at night for finger numbness 09/18/17   Darlyne Russian, MD  levonorgestrel (MIRENA) 20 MCG/24HR IUD 1 each by Intrauterine route once.    [provider]  meloxicam (MOBIC) 7.5 MG tablet Take 1 tablet twice a day with food 09/18/17   Darlyne Russian, MD    Family History Family History  Adopted: Yes    Social History Social History   Tobacco Use  . Smoking status: Never Smoker  . Smokeless tobacco: Never Used  Substance Use Topics  . Alcohol use: Yes  . Drug use: Not Currently     Allergies   Iohexol   Review of Systems Review of Systems  Constitutional: Negative.   Musculoskeletal:       She denies any neck pain or back pain.  She has no difficulty with range of motion of her  neck.  Her elbow has been without pain or swelling.  Her pain is located in the left wrist associated with numbness of the fingers of the left hand.  Neurological: Positive for weakness and numbness.     Physical Exam Triage Vital Signs ED Triage Vitals  Enc Vitals Group     BP 09/18/17 0838 127/90     Pulse Rate 09/18/17 0838 86     Resp --      Temp 09/18/17 0838 98.3 F (36.8 C)     Temp Source 09/18/17 0838 Oral     SpO2 09/18/17 0838 99 %     Weight 09/18/17 0839 189 lb (85.7 kg)     Height 09/18/17 0839 5\' 1"  (1.549 m)     Head Circumference --      Peak Flow --      Pain Score 09/18/17 0839 5     Pain Loc --      Pain Edu? --      Excl. in [place]? --    No data found.  Updated Vital Signs BP 127/90 (BP Location: Right Arm)   Pulse 86   Temp 98.3 F (36.8 C) (Oral)   Ht 5\' 1"  (1.549 m)  Wt 189 lb (85.7 kg)   SpO2 99%   BMI 35.71 kg/m   Visual Acuity Right Eye Distance:   Left Eye Distance:   Bilateral Distance:    Right Eye Near:   Left Eye Near:    Bilateral Near:     Physical Exam  Constitutional: She appears well-developed and well-nourished.  HENT:  Head: Normocephalic.  Neck: Normal range of motion. Neck supple.  Musculoskeletal:  There is a 1-1/2 x 1-1/2 inch area of swelling over the dorsum of the left wrist radial side.  There is significant pain with flexion extension and last 2 ulnar deviation and radial deviation.  There is a positive Tinel sign, positive Phalen sign positive forced flexion sign.  There is significant decreased grip strength.  She currently has no decreased sensation median nerve distribution.  Pulses are 2+ and symmetrical.  There is no venous distention.     UC Treatments / Results  Labs (all labs ordered are listed, but only abnormal results are displayed) Labs Reviewed - No data to display  EKG None  Radiology Dg Wrist Complete Left  Result Date: 09/18/2017 CLINICAL DATA:  Soft tissue swelling. EXAM: LEFT WRIST -  COMPLETE 3+ VIEW COMPARISON:  None. FINDINGS: Frontal, oblique, lateral, and ulnar deviation scaphoid images were obtained. No fracture or dislocation. Joint spaces appear normal. No erosive change or intra-articular calcification. IMPRESSION: No fracture or dislocation.  No appreciable arthropathy. Electronically Signed   By: Lowella Grip III M.D.   On: 09/18/2017 09:16    Procedures Procedures (including critical care time)  Medications Ordered in UC Medications - No data to display  Initial Impression / Assessment and Plan / UC Course  I have reviewed the triage vital signs and the nursing notes.  Pertinent labs & imaging results that were available during my care of the patient were reviewed by me and considered in my medical decision making (see chart for details).     Examination is consistent with carpal tunnel syndrome.  X-rays are unremarkable.  She did have significant swelling over the dorsum of the wrist.  She will be treated with a splint.  She is taken out of work.  She will be on meloxicam 7.5  1 twice a day with food.  Will start Neurontin 100 mg 1 or 2  at night and have Dr. Georgina Snell advance the dosage as tolerated.  She will be out of work. Final Clinical Impressions(s) / UC Diagnoses   Final diagnoses:  Carpal tunnel syndrome of left wrist     Discharge Instructions     Wear splint. Apply ice to your wrist during the day. Take medication as instructed. See Dr. Georgina Snell at 8:45 on Monday.    ED Prescriptions    Medication Sig Dispense Auth. Provider   gabapentin (NEURONTIN) 100 MG capsule Take 1 to 2 capsules at night for finger numbness 60 capsule Darlyne Russian, MD   meloxicam (MOBIC) 7.5 MG tablet Take 1 tablet twice a day with food 30 tablet Darlyne Russian, MD     Controlled Substance Prescriptions [place] Controlled Substance Registry consulted? Not Applicable   Darlyne Russian, MD 09/18/17 [id number]

## 2017-09-18 NOTE — ED Triage Notes (Signed)
Pt has had sx of carpel tunnel for over 1 year.  The last 2 months, it has woken her up for the past 2 months.  She has seen the chiropractor for the past week or so, and it has become worse.  Today it is swollen and she is unable to move it.

## 2017-09-23 ENCOUNTER — Encounter: Payer: Self-pay | Admitting: Sports Medicine

## 2017-10-01 ENCOUNTER — Ambulatory Visit (INDEPENDENT_AMBULATORY_CARE_PROVIDER_SITE_OTHER): Payer: Self-pay | Admitting: Sports Medicine

## 2017-10-01 ENCOUNTER — Encounter: Payer: Self-pay | Admitting: Sports Medicine

## 2017-10-01 DIAGNOSIS — G5602 Carpal tunnel syndrome, left upper limb: Secondary | ICD-10-CM

## 2017-10-01 NOTE — Progress Notes (Signed)
Subjective:    I'm seeing this patient as a consultation for:  Jerline Pain, NP  CC: left wrist pain  HPI: Elizabeth Rosales, a [phone] with pmh significant for tendinitis, is presenting today with a cc of left wrist pain that began a year ago and has progressively worsened.  Pt reportst that she is right handed but utilizes her left hand for work.  PT reports that the quality of pain is a numbness in her left  Digits 1-4 that will radiate to her forearm but not past her elbow.  Pt reports that about 6 months ago she started using a brace at night (almost every night), but the pain in her left hand wakes her up at night and peaks at about 6-7/10. Pt reports that at night pt will rotate her wrist and shake them out in order to alleviate the symptoms of numbness.   Pt reports that pain (numbness) is worsened with repetitive activity and alleviated with rest.   Pt reports that she was recently prescribed Meloxicam for a diagnosis of tendinitis (presented in outside urgent care approx 10-12 days ago) and gabapentin.  The Meloxicam completely alleviated the pain from the tendinitis but did not alleviate any of the numbness in her fingers.  The pt reports that for two days the gabapentin alleviated symptoms of numbness.    I reviewed the past medical history, family history, social history, surgical history, and allergies today and no changes were needed.  Please see the problem list section below in epic for further details.  Past Medical History: Past Medical History:  Diagnosis Date  . Non Hodgkin's lymphoma ([place])    Past Surgical History: No past surgical history on file. Social History: Social History   Socioeconomic History  . Marital status: Single    Spouse name: Not on file  . Number of children: Not on file  . Years of education: Not on file  . Highest education level: Not on file  Occupational History  . Not on file  Social Needs  . Financial resource strain: Not on file  . Food  insecurity:    Worry: Not on file    Inability: Not on file  . Transportation needs:    Medical: Not on file    Non-medical: Not on file  Tobacco Use  . Smoking status: Never Smoker  . Smokeless tobacco: Never Used  Substance and Sexual Activity  . Alcohol use: Yes  . Drug use: Not Currently  . Sexual activity: Not on file  Lifestyle  . Physical activity:    Days per week: Not on file    Minutes per session: Not on file  . Stress: Not on file  Relationships  . Social connections:    Talks on phone: Not on file    Gets together: Not on file    Attends religious service: Not on file    Active member of club or organization: Not on file    Attends meetings of clubs or organizations: Not on file    Relationship status: Not on file  Other Topics Concern  . Not on file  Social History Narrative  . Not on file   Family History: Family History  Adopted: Yes   Allergies: Allergies  Allergen Reactions  . Iohexol      Code: HIVES, Desc: pt had full body rash--05/29/06;kb, Onset Date: [id number]    Medications: See med rec.  Review of Systems: No headache, visual changes, nausea, vomiting, diarrhea, constipation, dizziness, abdominal  pain, skin rash, fevers, chills, night sweats, weight loss, swollen lymph nodes, body aches, joint swelling, muscle aches, chest pain, shortness of breath, mood changes, visual or auditory hallucinations.   Objective:   General: Well Developed, well nourished, and in no acute distress.  Neuro:  Extra-ocular muscles intact, able to move all 4 extremities, sensation grossly intact.  Deep tendon reflexes tested were normal. Psych: Alert and oriented, mood congruent with affect. ENT:  Ears and nose appear unremarkable.  Hearing grossly normal. Neck: Unremarkable overall appearance, trachea midline.  No visible thyroid enlargement. Eyes: Conjunctivae and lids appear unremarkable.  Pupils equal and round. Skin: Warm and dry, no rashes noted.    Cardiovascular: Pulses palpable, no extremity edema.   Left Wrist: Inspection normal with no visible erythema or swelling. ROM smooth and normal with good flexion and extension and ulnar/radial deviation that is symmetrical with opposite wrist. Palpation is normal over metacarpals, navicular, lunate, and TFCC; tendons without tenderness/ swelling No snuffbox tenderness. No tenderness over Canal of [place]. Strength 5/5 in all directions without pain. Negative Finkelstein, and tinel's (however numbness was  elicited when the flexor surface of the  distal forearm was vigorously tapped)  Positive phalens. (over first 4 digits) Negative Watson's test. Deep tendon reflexes are intact and bilateral (tricpes, biceps, brachioradialis) Negative Hoffman sign  . Impression and Recommendations:   This case required medical decision making of moderate complexity.  Assessment-Left Carpal Tunnel  In absence of adequate relief from nighttime splinting multiple times a week, and  use of a an antiinflammatory and an antineuropathic agent, Hydrosection of the median nerve in the carpal tunnel from surrounding structures was carried out. Pt advised to continue nightly splints.   Pt advised to follow up in 1 month to evaluate response to hydrosection.   ___________________________________________ Gwen Her. Dianah Field, M.D., ABFM., CAQSM. Primary Care and [place] Instructor of [employer] [facility] [employer]

## 2017-10-01 NOTE — Assessment & Plan Note (Signed)
At this point is failed conservative measures including nighttime splinting, gabapentin, anti-inflammatories, activity modification. Symptoms are classic and consistent with carpal tunnel syndrome. Median nerve hydrodissection today, continue nighttime splinting. Return to see me in 1 month to evaluate response.

## 2017-10-29 ENCOUNTER — Ambulatory Visit: Payer: Self-pay | Admitting: Sports Medicine

## 2017-11-11 ENCOUNTER — Other Ambulatory Visit: Payer: Self-pay

## 2017-11-11 ENCOUNTER — Emergency Department
Admission: EM | Admit: 2017-11-11 | Discharge: 2017-11-11 | Disposition: A | Discharge status: Home / Self Care | Payer: Self-pay | Source: Home / Self Care | Attending: Family Medicine | Admitting: Family Medicine

## 2017-11-11 DIAGNOSIS — J029 Acute pharyngitis, unspecified: Secondary | ICD-10-CM

## 2017-11-11 DIAGNOSIS — J329 Chronic sinusitis, unspecified: Secondary | ICD-10-CM

## 2017-11-11 DIAGNOSIS — B9689 Other specified bacterial agents as the cause of diseases classified elsewhere: Secondary | ICD-10-CM

## 2017-11-11 MED ORDER — AMOXICILLIN 500 MG PO CAPS: 500.0000 mg | ORAL_CAPSULE | Freq: Three times a day (TID) | ORAL | 0 refills | Status: DC

## 2017-11-11 MED ORDER — PREDNISONE 20 MG PO TABS: ORAL_TABLET | ORAL | 0 refills | Status: DC

## 2017-11-11 NOTE — Discharge Instructions (Signed)
°  You may take 500mg  acetaminophen every 4-6 hours or in combination with ibuprofen 400-600mg  every 6-8 hours as needed for pain, inflammation, and fever.  Be sure to drink at least eight 8oz glasses of water to stay well hydrated and get at least 8 hours of sleep at night, preferably more while sick.   Please take antibiotics as prescribed and be sure to complete entire course even if you start to feel better to ensure infection does not come back.  Please follow up with family medicine in 7-10 days if not improving.

## 2017-11-11 NOTE — ED Provider Notes (Signed)
[facility]    CSN: [id number] Arrival date & time: 11/11/17  1507     History   Chief Complaint Chief Complaint  Patient presents with  . Nasal Congestion  . Cough    HPI Elizabeth Rosales is a 37 y.o. female.   HPI Elizabeth Rosales is a 37 y.o. female presenting to UC with c/o persistent congestion and drainage for about 1 month.  She started to have cold like symptoms and took a Zpak, which helped the nasal drainage change to a clear color but no relief in sinus pressure.  Nasal discharge is now turning a dark yellow again with associated sore throat in the evening for the last 1-2 weeks.  Denies fever, chills, n/v/d. Minimal cough.    Past Medical History:  Diagnosis Date  . Non Hodgkin's lymphoma [facility])     Patient Active Problem List   Diagnosis Date Noted  . Carpal tunnel syndrome, left 10/01/2017    History reviewed. No pertinent surgical history.  OB History   None      Home Medications    Prior to Admission medications   Medication Sig Start Date End Date Taking? Authorizing Provider  amoxicillin (AMOXIL) 500 MG capsule Take 1 capsule (500 mg total) by mouth 3 (three) times daily. 11/11/17   Noe Gens, PA-C  gabapentin (NEURONTIN) 100 MG capsule Take 1 to 2 capsules at night for finger numbness 09/18/17   Darlyne Russian, MD  levonorgestrel (MIRENA) 20 MCG/24HR IUD 1 each by Intrauterine route once.    [provider]  meloxicam (MOBIC) 7.5 MG tablet Take 1 tablet twice a day with food 09/18/17   Darlyne Russian, MD  predniSONE (DELTASONE) 20 MG tablet 3 tabs po day one, then 2 po daily x 4 days 11/11/17   Noe Gens, PA-C    Family History Family History  Adopted: Yes    Social History Social History   Tobacco Use  . Smoking status: Never Smoker  . Smokeless tobacco: Never Used  Substance Use Topics  . Alcohol use: Yes  . Drug use: Not Currently     Allergies   Iohexol   Review of Systems Review of Systems    Constitutional: Negative for chills and fever.  HENT: Positive for congestion, ear pain, postnasal drip, sinus pressure, sinus pain and sore throat. Negative for trouble swallowing and voice change.   Respiratory: Positive for cough. Negative for shortness of breath.   Cardiovascular: Negative for chest pain and palpitations.  Gastrointestinal: Negative for abdominal pain, diarrhea, nausea and vomiting.  Musculoskeletal: Negative for arthralgias, back pain and myalgias.  Skin: Negative for rash.  Neurological: Positive for headaches. Negative for dizziness and light-headedness.     Physical Exam Triage Vital Signs ED Triage Vitals [11/11/17 1523]  Enc Vitals Group     BP 122/88     Pulse Rate (!) 109     Resp      Temp 98.3 F (36.8 C)     Temp Source Oral     SpO2 95 %     Weight 187 lb (84.8 kg)     Height 5\' 1"  (1.549 m)     Head Circumference      Peak Flow      Pain Score 2     Pain Loc      Pain Edu?      Excl. in [place]?    No data found.  Updated Vital Signs BP 122/88 (  BP Location: Right Arm)   Pulse (!) 109   Temp 98.3 F (36.8 C) (Oral)   Ht 5\' 1"  (1.549 m)   Wt 187 lb (84.8 kg)   SpO2 95%   BMI 35.33 kg/m       Physical Exam  Constitutional: She is oriented to person, place, and time. She appears well-developed and well-nourished. No distress.  HENT:  Head: Normocephalic and atraumatic.  Right Ear: Tympanic membrane normal.  Left Ear: Tympanic membrane normal.  Nose: Mucosal edema present. Right sinus exhibits maxillary sinus tenderness. Right sinus exhibits no frontal sinus tenderness. Left sinus exhibits maxillary sinus tenderness. Left sinus exhibits no frontal sinus tenderness.  Mouth/Throat: Uvula is midline and mucous membranes are normal. Posterior oropharyngeal erythema present. No oropharyngeal exudate, posterior oropharyngeal edema or tonsillar abscesses.  Eyes: EOM are normal.  Neck: Normal range of motion. Neck supple.  Cardiovascular:  Normal rate and regular rhythm.  Pulmonary/Chest: Effort normal and breath sounds normal. No stridor. No respiratory distress. She has no wheezes. She has no rales.  Musculoskeletal: Normal range of motion.  Lymphadenopathy:    She has no cervical adenopathy.  Neurological: She is alert and oriented to person, place, and time.  Skin: Skin is warm and dry. She is not diaphoretic.  Psychiatric: She has a normal mood and affect. Her behavior is normal.  Nursing note and vitals reviewed.    UC Treatments / Results  Labs (all labs ordered are listed, but only abnormal results are displayed) Labs Reviewed - No data to display  EKG None  Radiology No results found.  Procedures Procedures (including critical care time)  Medications Ordered in UC Medications - No data to display  Initial Impression / Assessment and Plan / UC Course  I have reviewed the triage vital signs and the nursing notes.  Pertinent labs & imaging results that were available during my care of the patient were reviewed by me and considered in my medical decision making (see chart for details).     Will start pt on amoxicillin given duration of worsening symptoms. Home care instructions provided.  Final Clinical Impressions(s) / UC Diagnoses   Final diagnoses:  Bacterial sinusitis  Sore throat     Discharge Instructions      You may take 500mg  acetaminophen every 4-6 hours or in combination with ibuprofen 400-600mg  every 6-8 hours as needed for pain, inflammation, and fever.  Be sure to drink at least eight 8oz glasses of water to stay well hydrated and get at least 8 hours of sleep at night, preferably more while sick.   Please take antibiotics as prescribed and be sure to complete entire course even if you start to feel better to ensure infection does not come back.  Please follow up with family medicine in 7-10 days if not improving.    ED Prescriptions    Medication Sig Dispense Auth.  Provider   predniSONE (DELTASONE) 20 MG tablet 3 tabs po day one, then 2 po daily x 4 days 11 tablet Seba Madole, Dannya O, PA-C   amoxicillin (AMOXIL) 500 MG capsule Take 1 capsule (500 mg total) by mouth 3 (three) times daily. 21 capsule Noe Gens, PA-C     Controlled Substance Prescriptions [place] Controlled Substance Registry consulted? Not Applicable   [place], [place] 11/11/17 1610

## 2017-11-11 NOTE — ED Triage Notes (Signed)
Has been going on for about 1 month will cold sx first, had a zpac, turned to clear drainage.  About 2 weeks ago started with yellow drainage.  Sore throat in the evenings from about 6-7 pm.

## 2018-05-02 ENCOUNTER — Encounter: Payer: Self-pay | Admitting: Plastic Surgery

## 2018-05-02 ENCOUNTER — Ambulatory Visit (INDEPENDENT_AMBULATORY_CARE_PROVIDER_SITE_OTHER): Payer: Self-pay | Admitting: Plastic Surgery

## 2018-05-02 VITALS — BP 116/64 | HR 119 | Ht 61.0 in | Wt 183.0 lb

## 2018-05-02 DIAGNOSIS — Z719 Counseling, unspecified: Secondary | ICD-10-CM

## 2018-05-08 ENCOUNTER — Ambulatory Visit (INDEPENDENT_AMBULATORY_CARE_PROVIDER_SITE_OTHER): Payer: Self-pay | Admitting: Plastic Surgery

## 2018-05-08 VITALS — BP 116/64 | HR 100 | Ht 61.0 in | Wt 183.0 lb

## 2018-05-08 DIAGNOSIS — Z719 Counseling, unspecified: Secondary | ICD-10-CM

## 2018-05-08 DIAGNOSIS — G5602 Carpal tunnel syndrome, left upper limb: Secondary | ICD-10-CM

## 2018-05-09 ENCOUNTER — Encounter: Payer: Self-pay | Admitting: Plastic Surgery

## 2018-05-09 DIAGNOSIS — Z719 Counseling, unspecified: Secondary | ICD-10-CM | POA: Insufficient documentation

## 2018-05-09 NOTE — Progress Notes (Signed)
Botulinum Toxin and Filler Injection Procedure Note  Procedure: Cosmetic botulinum toxin and Filler administration  Pre-operative Diagnosis: Dynamic rhytides and midface volume loss  Post-operative Diagnosis: Same  Complications:  None  Brief history: The patient desires botulinum toxin injection of her forehead. I discussed with the patient this proposed procedure of botulinum toxin injections, which is customized depending on the particular needs of the patient. It is performed on facial rhytids as a temporary correction. The alternatives were discussed with the patient. The risks were addressed including bleeding, scarring, infection, damage to deeper structures, asymmetry, and chronic pain, which may occur infrequently after a procedure. The individual's choice to undergo a surgical procedure is based on the comparison of risks to potential benefits. Other risks include unsatisfactory results, brow ptosis, eyelid ptosis, allergic reaction, temporary paralysis, which should go away with time, bruising, blurring disturbances and delayed healing. Botulinum toxin injections do not arrest the aging process or produce permanent tightening of the eyelid.  Operative intervention maybe necessary to maintain the results of a blepharoplasty or botulinum toxin. The patient understands and wishes to proceed. An informed consent was signed and informational brochures given to her prior to the procedure.  Procedure: The area was prepped with alcohol and dried with a clean gauze. Using a clean technique, the botulinum toxin was diluted with 1.25 cc of preservative-free normal saline which was slowly injected with an 18 gauge needle in a tuberculin syringes.  A 32 gauge needles were then used to inject the botulinum toxin. This mixture allow for an aliquot of 5 units per 0.1 cc in each injection site.    Subsequently the mixture was injected in the glabellar and forehead area with preservation of the temporal  branch to the lateral eyebrow as well as into each lateral canthal area beginning from the lateral orbital rim medial to the zygomaticus major in 3 separate areas. A total of 90 Units of botulinum toxin was used. The forehead and glabellar area was injected with care to inject intramuscular only while holding pressure on the supratrochlear vessels in each area during each injection on either side of the medial corrugators. The injection proceeded vertically superiorly to the medial 2/3 of the frontalis muscle and superior 2/3 of the lateral frontalis, again with preservation of the frontal branch.  LYFT: The midface area was injected at the 3 sub-regions of the mid-face: zygomaticomalar region, anteromedial cheek region, and submalar region for a total of one syringe on each side of the face. The technique used was serial puncture with equal injections in the 3 sub-regions: the zygomaticomalar region, the anteromedial cheek, and the submalar region.    Restylane-L: The upper and lower lips were injected for improved fullness and for a lift. No complications were noted. Light pressure was held for 5 minutes. She was instructed explicitly in post-operative care.  Dysport LOT:  [id number] EXP:  12/19/2018  Restylane [place] LOT: [id number] EXP: 2020/07/18  Restylane L LOT: [id number] EXP: 2019-09-18

## 2018-05-12 ENCOUNTER — Encounter: Payer: Self-pay | Admitting: Emergency Medicine

## 2018-05-12 ENCOUNTER — Other Ambulatory Visit: Payer: Self-pay

## 2018-05-12 ENCOUNTER — Emergency Department
Admission: EM | Admit: 2018-05-12 | Discharge: 2018-05-12 | Disposition: A | Discharge status: Home / Self Care | Payer: Self-pay | Source: Home / Self Care | Attending: Internal Medicine | Admitting: Internal Medicine

## 2018-05-12 DIAGNOSIS — M779 Enthesopathy, unspecified: Secondary | ICD-10-CM

## 2018-05-12 MED ORDER — MELOXICAM 7.5 MG PO TABS: 7.5000 mg | ORAL_TABLET | Freq: Two times a day (BID) | ORAL | 2 refills | Status: AC | PRN

## 2018-05-12 NOTE — ED Triage Notes (Signed)
PT reports left hand pain that has worsened for over a month. PT was previously treated for carpal tunnel and tendonitis in same hand.

## 2018-05-27 ENCOUNTER — Encounter: Payer: Self-pay | Admitting: Plastic Surgery

## 2018-05-27 NOTE — Progress Notes (Signed)
     Patient ID: Elizabeth Rosales, female    DOB: [birth year], 38 y.o.   MRN: [id number]   Chief Complaint  Patient presents with  . Advice Only    Cosmetic    The patient is a 38 year old white female here for consultation for facial rejuvenation.  She has good skin overall.  She is a Fitzpatrick 1.  She has mild loss of midface volume and very little volume up to her upper lips.  She has not been on any medications that would interfere with her ability to receive aesthetic treatment.   Review of Systems  Constitutional: Negative.   HENT: Negative.   Eyes: Negative.   Respiratory: Negative.   Cardiovascular: Negative.   Gastrointestinal: Negative.   Endocrine: Negative.   Genitourinary: Negative.   Musculoskeletal: Negative.   Skin: Negative.   Psychiatric/Behavioral: Negative.     Past Medical History:  Diagnosis Date  . Non Hodgkin's lymphoma ([place])     History reviewed. No pertinent surgical history.    Current Outpatient Medications:  .  levonorgestrel (MIRENA) 20 MCG/24HR IUD, 1 each by Intrauterine route once., Disp: , Rfl:  .  meloxicam (MOBIC) 7.5 MG tablet, Take 1 tablet (7.5 mg total) by mouth 2 (two) times daily as needed for pain. Do NOT take on an empty stomach. Drink plenty of water, Disp: 14 tablet, Rfl: 2   Objective:   Vitals:   05/02/18 1318  BP: 116/64  Pulse: (!) 119  SpO2: 97%    Physical Exam Vitals signs and nursing note reviewed.  Constitutional:      Appearance: Normal appearance.  HENT:     Head: Normocephalic.     Right Ear: External ear normal.     Left Ear: External ear normal.     Nose: Nose normal.     Mouth/Throat:     Mouth: Mucous membranes are moist.  Cardiovascular:     Rate and Rhythm: Normal rate.  Pulmonary:     Effort: Pulmonary effort is normal.  Abdominal:     General: Abdomen is flat.  Skin:    General: Skin is warm.  Neurological:     General: No focal deficit present.     Mental Status: She is alert.    Psychiatric:        Mood and Affect: Mood normal.        Behavior: Behavior normal.        Thought Content: Thought content normal.        Judgment: Judgment normal.     Assessment & Plan:  Encounter for counseling Recommend filler for midface and Botox.  I think we will need to be very careful with filler in the upper lip but I am willing to try some for her.   [place], DO

## 2019-09-28 ENCOUNTER — Ambulatory Visit: Payer: Self-pay

## 2019-10-05 ENCOUNTER — Ambulatory Visit (INDEPENDENT_AMBULATORY_CARE_PROVIDER_SITE_OTHER): Payer: Self-pay

## 2019-10-05 ENCOUNTER — Other Ambulatory Visit: Payer: Self-pay

## 2019-10-05 VITALS — BP 142/90 | HR 87 | Temp 98.2°F | Ht 61.0 in | Wt 170.0 lb

## 2019-10-05 DIAGNOSIS — Z719 Counseling, unspecified: Secondary | ICD-10-CM

## 2019-10-07 NOTE — Patient Instructions (Signed)
Pt will protect skin from sun- use sunscreen if needed Moisturize skin & avoid scrubs/retina for approx 7-10 days She will call for any concerns

## 2019-10-07 NOTE — Progress Notes (Signed)
Preoperative Dx: facial aging/hyperpigmentation  Postoperative Dx:  same  Procedure: laser to face  Anesthesia: EMLA -pt applied 30 mins prior to procedure  Description of Procedure:  Risks and complications were explained to the patient. Consent was confirmed and signed. Time out was called and all information was confirmed to be correct. The area  was prepped with alcohol and wiped dry. The IPL  laser was set at the following:  skin -1/ suntan light -  7.0 J/cheeks/face then reduced to 6.0 forehead & mouth area  The entire face was lasered The patient tolerated the procedure well and there were no complications.  Aloe applied & provided ice for pt to use She is reminded to protect skin with sunscreen & moisturizer & avoid retina products & no abrasive scrubs for approx. 7 to 10 days  She is reminded that she may experience redness/peeling & irritation Patient to f/u in 4 weeks- she may wait until after summer & vacations to schedule next treatment  she will call for any concerns

## 2020-02-19 ENCOUNTER — Other Ambulatory Visit: Payer: Self-pay

## 2020-02-26 ENCOUNTER — Other Ambulatory Visit: Payer: Self-pay | Admitting: Family

## 2020-02-26 ENCOUNTER — Telehealth: Payer: Self-pay | Admitting: Family

## 2020-02-26 ENCOUNTER — Other Ambulatory Visit: Payer: Self-pay

## 2020-02-26 DIAGNOSIS — U071 COVID-19: Secondary | ICD-10-CM

## 2020-02-26 DIAGNOSIS — E663 Overweight: Secondary | ICD-10-CM

## 2020-02-26 NOTE — Progress Notes (Signed)
I connected by phone with Elizabeth Rosales on 02/26/2020 at 7:11 PM to discuss the potential use of a new treatment for mild to moderate COVID-19 viral infection in non-hospitalized patients.  This patient is a 39 y.o. female that meets the FDA criteria for Emergency Use Authorization of COVID monoclonal antibody casirivimab/imdevimab or bamlanivimab/eteseviamb.  Has a (+) direct SARS-CoV-2 viral test result  Has mild or moderate COVID-19   Is NOT hospitalized due to COVID-19  Is within 10 days of symptom onset  Has at least one of the high risk factor(s) for progression to severe COVID-19 and/or hospitalization as defined in EUA.  Specific high risk criteria : BMI > 25   I have spoken and communicated the following to the patient or parent/caregiver regarding COVID monoclonal antibody treatment:  1. FDA has authorized the emergency use for the treatment of mild to moderate COVID-19 in adults and pediatric patients with positive results of direct SARS-CoV-2 viral testing who are 72 years of age and older weighing at least 40 kg, and who are at high risk for progressing to severe COVID-19 and/or hospitalization.  2. The significant known and potential risks and benefits of COVID monoclonal antibody, and the extent to which such potential risks and benefits are unknown.  3. Information on available alternative treatments and the risks and benefits of those alternatives, including clinical trials.  4. Patients treated with COVID monoclonal antibody should continue to self-isolate and use infection control measures (e.g., wear mask, isolate, social distance, avoid sharing personal items, clean and disinfect "high touch" surfaces, and frequent handwashing) according to CDC guidelines.   5. The patient or parent/caregiver has the option to accept or refuse COVID monoclonal antibody treatment.  After reviewing this information with the patient, the patient has agreed to receive one of the available  covid 19 monoclonal antibodies and will be provided an appropriate fact sheet prior to infusion.   Mauricio Po, FNP 02/26/2020 7:11 PM

## 2020-02-26 NOTE — Telephone Encounter (Signed)
Called to Discuss with patient about Covid symptoms and the use of the monoclonal antibody infusion for those with mild to moderate Covid symptoms and at a high risk of hospitalization.     Pt appears to qualify for this infusion due to co-morbid conditions and/or a member of an at-risk group in accordance with the FDA Emergency Use Authorization.    Elizabeth Rosales was seen at the [facility] on 10/6 and tested positive for COVID. Qualifying risk factors include BMI >25.  Symptoms first started 10/5. Currently experiencing congestion.   I spoke with Elizabeth Rosales regarding the risks and benefits of treatment with Regeneron and she wishes to proceed with treatment.   [place],   You have been scheduled to receive Regeneron (the monoclonal antibody we discussed) on : 02/27/20 at 11:30am  If you have been tested outside of a [facility] - you MUST bring a copy of your positive test with you the morning of your appointment. You may take a photo of this and upload to your MyChart portal or have the testing facility fax the result to [phone]    The address for the infusion clinic site is:  --GPS address is [place] - the parking is located near [employer] building where you will see  COVID19 Infusion feather banner marking the entrance to parking.   (see photos below)            --Enter into the 2nd entrance where the "wave, flag banner" is at the road. Turn into this 2nd entrance and immediately turn left to park in 1 of the 5 parking spots.   --Please stay in your car and call the desk for assistance inside [phone].   --Average time in department is roughly 2 hours for Regeneron treatment - this includes preparation of the medication, IV start and the required 1 hour monitoring after the infusion.    Should you develop worsening shortness of breath, chest pain or severe breathing problems please do not wait for this appointment and go to the Emergency room  for evaluation and treatment. You will undergo another oxygen screen before your infusion to ensure this is the best treatment option for you. There is a chance that the best decision may be to send you to the Emergency Room for evaluation at the time of your appointment.   The day of your visit you should: Marland Kitchen Get plenty of rest the night before and drink plenty of water . Eat a light meal/snack before coming and take your medications as prescribed  . Wear warm, comfortable clothes with a shirt that can roll-up over the elbow (will need IV start).  . Wear a mask  . Consider bringing some activity to help pass the time  Many commercial insurers are waiving bills related to [place] treatment however some have ranged from $300-640. We are starting to see some insurers send bills to patients later for the administration of the medication - we are learning more information but you may receive a bill after your appointment.  Please contact your insurance agent to discuss prior to your appointment if you would like further details about billing specific to your policy.    The CPT code is [id number] for your reference.    I hope this helps find you feeling better,  Elizabeth Rosales

## 2020-02-27 ENCOUNTER — Ambulatory Visit (HOSPITAL_COMMUNITY)
Admission: RE | Admit: 2020-02-27 | Discharge: 2020-02-27 | Disposition: A | Discharge status: Home / Self Care | Payer: HRSA Program | Source: Ambulatory Visit | Attending: Pulmonary Disease | Admitting: Pulmonary Disease

## 2020-02-27 DIAGNOSIS — U071 COVID-19: Secondary | ICD-10-CM | POA: Diagnosis not present

## 2020-02-27 DIAGNOSIS — E663 Overweight: Secondary | ICD-10-CM | POA: Insufficient documentation

## 2020-02-27 MED ORDER — ALBUTEROL SULFATE HFA 108 (90 BASE) MCG/ACT IN AERS: 2.0000 | INHALATION_SPRAY | Freq: Once | RESPIRATORY_TRACT | Status: DC | PRN

## 2020-02-27 MED ORDER — SODIUM CHLORIDE 0.9 % IV SOLN: Freq: Once | INTRAVENOUS | Status: AC

## 2020-02-27 MED ORDER — DIPHENHYDRAMINE HCL 50 MG/ML IJ SOLN: 50.0000 mg | Freq: Once | INTRAMUSCULAR | Status: DC | PRN

## 2020-02-27 MED ORDER — FAMOTIDINE IN NACL 20-0.9 MG/50ML-% IV SOLN: 20.0000 mg | Freq: Once | INTRAVENOUS | Status: DC | PRN

## 2020-02-27 MED ORDER — SODIUM CHLORIDE 0.9 % IV SOLN
Freq: Once | INTRAVENOUS | Status: DC
  Filled 2020-02-27: qty 20

## 2020-02-27 MED ORDER — EPINEPHRINE 0.3 MG/0.3ML IJ SOAJ: 0.3000 mg | Freq: Once | INTRAMUSCULAR | Status: DC | PRN

## 2020-02-27 MED ORDER — SODIUM CHLORIDE 0.9 % IV SOLN: INTRAVENOUS | Status: DC | PRN

## 2020-02-27 MED ORDER — METHYLPREDNISOLONE SODIUM SUCC 125 MG IJ SOLR: 125.0000 mg | Freq: Once | INTRAMUSCULAR | Status: DC | PRN

## 2020-02-27 NOTE — Progress Notes (Addendum)
  Diagnosis: COVID-19  Physician: Asencion Noble, MD  Procedure: Covid Infusion Clinic Med: casirivimab\imdevimab infusion - Provided patient with casirivimab\imdevimab fact sheet for patients, parents and caregivers prior to infusion.  Complications: No immediate complications noted.  Discharge: Discharged home   Elizabeth Rosales 02/27/2020

## 2020-02-27 NOTE — Discharge Instructions (Signed)

## 2020-03-25 ENCOUNTER — Other Ambulatory Visit: Payer: Self-pay

## 2020-04-01 ENCOUNTER — Other Ambulatory Visit: Payer: Self-pay

## 2020-04-01 ENCOUNTER — Ambulatory Visit (INDEPENDENT_AMBULATORY_CARE_PROVIDER_SITE_OTHER): Payer: Self-pay

## 2020-04-01 VITALS — BP 138/82 | HR 86 | Temp 98.3°F | Ht 61.0 in | Wt 170.0 lb

## 2020-04-01 DIAGNOSIS — Z719 Counseling, unspecified: Secondary | ICD-10-CM

## 2020-05-12 ENCOUNTER — Emergency Department
Admission: EM | Admit: 2020-05-12 | Discharge: 2020-05-12 | Disposition: A | Discharge status: Home / Self Care | Payer: Self-pay | Source: Home / Self Care | Attending: Family Medicine | Admitting: Family Medicine

## 2020-05-12 ENCOUNTER — Other Ambulatory Visit: Payer: Self-pay

## 2020-05-12 ENCOUNTER — Emergency Department: Admit: 2020-05-12 | Discharge status: Absent without leave | Payer: Self-pay | Source: Home / Self Care

## 2020-05-12 DIAGNOSIS — J029 Acute pharyngitis, unspecified: Secondary | ICD-10-CM

## 2020-05-12 LAB — POCT RAPID STREP A (OFFICE): Rapid Strep A Screen: NEGATIVE

## 2020-05-12 NOTE — ED Triage Notes (Addendum)
Pt c/o sore throat since Monday. Also having some congestion. Denies fever. Ibuprofen prn. Had covid in Oct. Has not had covid vaccinations.

## 2020-05-12 NOTE — ED Provider Notes (Signed)
  [place]   [id number] 05/12/20 Arrival Time: 9024  ASSESSMENT & PLAN:  1. Sore throat     No signs of peritonsillar abscess. Discussed. Declines throat culture; self pay.  Labs Reviewed  POCT RAPID STREP A (OFFICE)   OTC analgesics and throat care as needed   Discharge Instructions      You may use over the counter ibuprofen or acetaminophen as needed.   For a sore throat, over the counter products such as [employer] Peroxyl Mouth Sore Rinse or Chloraseptic Sore Throat Spray may provide some temporary relief.   Reviewed expectations re: course of current medical issues. Questions answered. Outlined signs and symptoms indicating need for more acute intervention. Patient verbalized understanding. After Visit Summary given.   SUBJECTIVE:  Elizabeth Rosales is a 39 y.o. female who reports a sore throat. Onset gradual beginning 2 d ago. Symptoms have progressed to a point and plateaued since beginning; without voice changes. No respiratory symptoms. Normal PO intake but reports discomfort with swallowing. No specific alleviating factors. Fever: absent. No neck pain or swelling. No associated nausea, vomiting, or abdominal pain. Known sick contacts: none. Recent travel: none.   OBJECTIVE:  Vitals:   05/12/20 0848  BP: 134/89  Pulse: 95  Resp: 17  Temp: 98 F (36.7 C)  TempSrc: Oral  SpO2: 99%     General appearance: alert; no distress HEENT: throat with mild erythema; tonsils are large but she reports this is known; without exudates; uvula is midline Neck: supple with FROM; no lymphadenopathy Lungs: speaks full sentences without difficulty; unlabored Abd: soft; non-tender Skin: reveals no rash; warm and dry Psychological: alert and cooperative; normal mood and affect  Allergies  Allergen Reactions  . Iohexol      Code: HIVES, Desc: pt had full body rash--05/29/06;kb, Onset Date: [id number]     Past Medical History:  Diagnosis Date  . Non Hodgkin's  lymphoma ([place])    Social History   Socioeconomic History  . Marital status: Single    Spouse name: Not on file  . Number of children: Not on file  . Years of education: Not on file  . Highest education level: Not on file  Occupational History  . Not on file  Tobacco Use  . Smoking status: Never Smoker  . Smokeless tobacco: Never Used  Vaping Use  . Vaping Use: Never used  Substance and Sexual Activity  . Alcohol use: Yes  . Drug use: Not Currently  . Sexual activity: Not on file  Other Topics Concern  . Not on file  Social History Narrative  . Not on file   Social Determinants of Health   Financial Resource Strain: Not on file  Food Insecurity: Not on file  Transportation Needs: Not on file  Physical Activity: Not on file  Stress: Not on file  Social Connections: Not on file  Intimate Partner Violence: Not on file   Family History  Adopted: Jerre Simon, MD 05/12/20 [phone]

## 2020-05-12 NOTE — Discharge Instructions (Addendum)
You may use over the counter ibuprofen or acetaminophen as needed.  For a sore throat, over the counter products such as [employer]gate Peroxyl Mouth Sore Rinse or Chloraseptic Sore Throat Spray may provide some temporary relief. Your rapid strep test was negative today. We have sent your throat swab for culture and will let you know of any positive results. 

## 2020-05-17 ENCOUNTER — Telehealth: Payer: Self-pay

## 2020-05-17 NOTE — Telephone Encounter (Signed)
Pt left message on nurse line. States she was seen and tested neg for strep. Wanted to know if zpak could be called in. Spoke with pt today and she states she is actually feeling much better. If sxs worsen, she will get reseen.

## 2020-05-22 ENCOUNTER — Other Ambulatory Visit: Payer: Self-pay

## 2020-05-22 ENCOUNTER — Emergency Department (INDEPENDENT_AMBULATORY_CARE_PROVIDER_SITE_OTHER)
Admission: EM | Admit: 2020-05-22 | Discharge: 2020-05-22 | Disposition: A | Discharge status: Home / Self Care | Payer: Self-pay | Source: Home / Self Care

## 2020-05-22 ENCOUNTER — Emergency Department: Admit: 2020-05-22 | Discharge status: Against Medical Advice | Payer: Self-pay

## 2020-05-22 DIAGNOSIS — J029 Acute pharyngitis, unspecified: Secondary | ICD-10-CM

## 2020-05-22 DIAGNOSIS — J019 Acute sinusitis, unspecified: Secondary | ICD-10-CM

## 2020-05-22 MED ORDER — PREDNISONE 20 MG PO TABS: 20.0000 mg | ORAL_TABLET | Freq: Every day | ORAL | 0 refills | Status: AC

## 2020-05-22 MED ORDER — AMOXICILLIN-POT CLAVULANATE 875-125 MG PO TABS: 1.0000 | ORAL_TABLET | Freq: Two times a day (BID) | ORAL | 0 refills | Status: DC

## 2020-05-22 MED ORDER — AMOXICILLIN-POT CLAVULANATE 875-125 MG PO TABS: 1.0000 | ORAL_TABLET | Freq: Two times a day (BID) | ORAL | 0 refills | Status: AC

## 2020-05-22 MED ORDER — PREDNISONE 20 MG PO TABS: 20.0000 mg | ORAL_TABLET | Freq: Every day | ORAL | 0 refills | Status: DC

## 2020-05-22 NOTE — ED Triage Notes (Signed)
Unrelieved symptoms of sore throat since last visit 12/23 pt had neg strep results. Pt still feels unwell.

## 2020-05-22 NOTE — ED Provider Notes (Signed)
[facility]    CSN: [id number] Arrival date & time: 05/22/20  1125      History   Chief Complaint Chief Complaint  Patient presents with  . Sore Throat    HPI Elizabeth Rosales is a 40 y.o. female.   HPI  Patient presents today with symptoms of sore throat and congestion ongoing since 05/12/20. The symptoms have waxed and waned. She had a negative rapid strep and culture was not collected. She has managed with OTC medication since her visit without improvement of symptoms. Coughing productive and with recent wheezing and mild increased work of breathing. Diagnosed and recovered form COVID in October. No known recent exposure to COVID or flu.    Past Medical History:  Diagnosis Date  . Non Hodgkin's lymphoma [facility])     Patient Active Problem List   Diagnosis Date Noted  . Encounter for counseling 05/09/2018  . Carpal tunnel syndrome, left 10/01/2017    History reviewed. No pertinent surgical history.  OB History   No obstetric history on file.      Home Medications    Prior to Admission medications   Medication Sig Start Date End Date Taking? Authorizing Provider  levonorgestrel (MIRENA) 20 MCG/24HR IUD 1 each by Intrauterine route once.    [provider]  meloxicam (MOBIC) 7.5 MG tablet Take 1 tablet (7.5 mg total) by mouth 2 (two) times daily as needed for pain. Do NOT take on an empty stomach. Drink plenty of water Patient not taking: Reported on 10/05/2019 05/12/18   Harrie Foreman, MD    Family History Family History  Adopted: Yes    Social History Social History   Tobacco Use  . Smoking status: Never Smoker  . Smokeless tobacco: Never Used  Vaping Use  . Vaping Use: Never used  Substance Use Topics  . Alcohol use: Yes  . Drug use: Not Currently     Allergies   Iohexol   Review of Systems Review of Systems Pertinent negatives listed in HPI  Physical Exam Triage Vital Signs ED Triage Vitals  Enc Vitals Group      BP 05/22/20 1310 128/88     Pulse Rate 05/22/20 1310 82     Resp 05/22/20 1310 17     Temp 05/22/20 1310 98 F (36.7 C)     Temp Source 05/22/20 1310 Oral     SpO2 05/22/20 1310 98 %     Weight 05/22/20 1308 170 lb (77.1 kg)     Height 05/22/20 1308 5\' 1"  (1.549 m)     Head Circumference --      Peak Flow --      Pain Score 05/22/20 1308 2     Pain Loc --      Pain Edu? --      Excl. in [place]? --    No data found.  Updated Vital Signs BP 128/88 (BP Location: Left Arm)   Pulse 82   Temp 98 F (36.7 C) (Oral)   Resp 17   Ht 5\' 1"  (1.549 m)   Wt 170 lb (77.1 kg)   SpO2 98%   BMI 32.12 kg/m   Visual Acuity Right Eye Distance:   Left Eye Distance:   Bilateral Distance:    Right Eye Near:   Left Eye Near:    Bilateral Near:     Physical Exam  General Appearance:    Alert, cooperative, no distress  HENT:   Normocephalic, ears normal, nares mucosal edema  with congestion, rhinorrhea, oropharynx erythematous/edematous   Eyes:    PERRL, conjunctiva/corneas clear, EOM's intact       Lungs:     Clear to auscultation bilaterally, respirations unlabored  Heart:    Regular rate and rhythm  Neurologic:   Awake, alert, oriented x 3. No apparent focal neurological           defect.     UC Treatments / Results  Labs (all labs ordered are listed, but only abnormal results are displayed) Labs Reviewed - No data to display  EKG   Radiology No results found.  Procedures Procedures (including critical care time)  Medications Ordered in UC Medications - No data to display  Initial Impression / Assessment and Plan / UC Course  I have reviewed the triage vital signs and the nursing notes.  Pertinent labs & imaging results that were available during my care of the patient were reviewed by me and considered in my medical decision making (see chart for details).    Treating for acute sinusitis given the length of time symptoms have been present and recent COVID infection. Strict  follow-up precautions given if symptoms worsen. Treatment per discharge medications.  Final Clinical Impressions(s) / UC Diagnoses   Final diagnoses:  Acute non-recurrent sinusitis, unspecified location  Pharyngitis, unspecified etiology   Discharge Instructions   None    ED Prescriptions    Medication Sig Dispense Auth. Provider   amoxicillin-clavulanate (AUGMENTIN) 875-125 MG tablet  (Status: Discontinued) Take 1 tablet by mouth 2 (two) times daily. 20 tablet Bing Neighbors, FNP   predniSONE (DELTASONE) 20 MG tablet  (Status: Discontinued) Take 1 tablet (20 mg total) by mouth daily with breakfast. 5 tablet Bing Neighbors, FNP   amoxicillin-clavulanate (AUGMENTIN) 875-125 MG tablet Take 1 tablet by mouth 2 (two) times daily. 20 tablet Bing Neighbors, FNP   predniSONE (DELTASONE) 20 MG tablet Take 1 tablet (20 mg total) by mouth daily with breakfast. 5 tablet Bing Neighbors, FNP     PDMP not reviewed this encounter.   Bing Neighbors, FNP 05/25/20 [phone]

## 2020-06-10 ENCOUNTER — Ambulatory Visit (INDEPENDENT_AMBULATORY_CARE_PROVIDER_SITE_OTHER): Payer: Self-pay

## 2020-06-10 ENCOUNTER — Other Ambulatory Visit: Payer: Self-pay

## 2020-06-10 VITALS — BP 140/87 | HR 88 | Temp 98.4°F | Ht 61.0 in | Wt 170.0 lb

## 2020-06-10 DIAGNOSIS — Z719 Counseling, unspecified: Secondary | ICD-10-CM

## 2020-06-27 NOTE — Progress Notes (Signed)
Preoperative Dx: facial aging/hyperpigmentation  Postoperative Dx:  same  Procedure: laser to face  Anesthesia: EMLA applied 30 mins prior to procedure.  Description of Procedure:  Risks and complications were explained to the patient. Consent was confirmed and signed. Time out was called and all information was confirmed to be correct. The area was prepped with alcohol and wiped dry.  The IPL  laser was set at the following:  skin -1/ suntan - light = 7.0J/cm2 The entire face was lasered The patient tolerated the procedure well and there were no complications.   Aloe applied after procedure She is reminded to protect skin with sunscreen & moisturizer & avoid retina products & no abrasive scrubs for approx. 10 days   She will call for any concerns

## 2020-06-27 NOTE — Progress Notes (Signed)
Preoperative Dx: facial aging/pigmentation  Postoperative Dx:  same  Procedure: laser to face  Anesthesia: EMLA -pt applied 30 mins prior to procedure  Description of Procedure:  Risks and complications were explained to the patient. Consent was confirmed and signed. Time out was called and all information was confirmed to be correct. The area was prepped with alcohol and wiped dry.  The IPL laser was set at the following:  skin -1/ suntan- light -  7.0J/cm2 reduced to 6.0J for forehead/mouth area  The entire face was lasered The patient tolerated the procedure well and there were no complications.  Aloe applied & she is reminded to protect skin with sunscreen & moisturizer & avoid retina products & no abrasive scrubs for approx. 7 to 10 days  She is reminded that she may experience redness/peeling & irritation Patient to f/u in 4 weeks  She will call for any concerns

## 2020-06-27 NOTE — Patient Instructions (Signed)
Pt will use sunscreen/moisturizer & avoid retinol or harsh scrubs. She will f/u in 4 weeks She will call for any concerns.

## 2020-06-27 NOTE — Patient Instructions (Signed)
Pt is reminded to protect skin from sun- use sunscreen & moisturizer. Avoid retinol products or any harsh scrubs. She understands that she may have dryness/redness. She will call for any concerns. F/U 4 weeks

## 2020-07-22 ENCOUNTER — Ambulatory Visit (INDEPENDENT_AMBULATORY_CARE_PROVIDER_SITE_OTHER): Payer: Self-pay

## 2020-07-22 ENCOUNTER — Other Ambulatory Visit: Payer: Self-pay

## 2020-07-22 VITALS — BP 134/83 | HR 84 | Temp 98.4°F | Ht 61.0 in | Wt 170.0 lb

## 2020-07-22 DIAGNOSIS — Z719 Counseling, unspecified: Secondary | ICD-10-CM

## 2020-07-22 NOTE — Progress Notes (Signed)
Preoperative Dx: hyperpigmentation/red   Postoperative Dx:  same  Procedure: laser to face  Anesthesia: EMLA -pt applied 40 mins prior to procedure  Description of Procedure:  Risks and complications were explained to the patient. Consent was confirmed and signed. Time out was called and all information was confirmed to be correct. The area was prepped with alcohol and wiped dry.  The IPL  laser was set at the following:  skin -2/ suntan light - to red areas/ cheeks 7.0J The patient tolerated the procedure well and there were no complications.  Aloe applied  She is reminded to protect skin with sunscreen & moisturizer & avoid retina products & no abrasive scrubs for approx. 7 to 10 days  She is reminded that she may experience redness/peeling & irritation

## 2020-07-22 NOTE — Patient Instructions (Signed)
Pt will use sunscreen & moisturizer to face Avoid sun. She is reminded that she may have redness/irritation/ & dry skin. She will f/u in 4-6 weeks

## 2020-08-10 ENCOUNTER — Encounter: Payer: Self-pay | Admitting: Plastic Surgery

## 2020-08-18 ENCOUNTER — Other Ambulatory Visit: Payer: Self-pay

## 2020-08-18 ENCOUNTER — Encounter: Payer: Self-pay | Admitting: Plastic Surgery

## 2020-08-31 ENCOUNTER — Ambulatory Visit (INDEPENDENT_AMBULATORY_CARE_PROVIDER_SITE_OTHER): Payer: Self-pay | Admitting: Plastic Surgery

## 2020-08-31 ENCOUNTER — Other Ambulatory Visit: Payer: Self-pay

## 2020-08-31 DIAGNOSIS — Z411 Encounter for cosmetic surgery: Secondary | ICD-10-CM

## 2020-08-31 NOTE — Progress Notes (Signed)
Patient presents to discuss Botox treatment. She's had it before but can't remember the amount. She's primarily focused on her glabella and would like to hold off on treating any other areas of the face today. On exam she has dynamic and static lines in the [place] area and very mild dynamic lines in the forehead and crows feet. We discussed the risks and benefits of Botox treatment and she liked it to proceed. Take a Elyse Hsu was prepped and the alcohol pad and 12 units of Botox were distributed in that area. She tolerated this fine. We'll have her make a touch up appointment in two weeks in the event that she needs it and plan to see her next visit. All of her questions were answered.

## 2020-09-15 ENCOUNTER — Encounter: Payer: Self-pay | Admitting: Plastic Surgery

## 2021-03-17 ENCOUNTER — Other Ambulatory Visit: Payer: Self-pay

## 2021-03-17 DIAGNOSIS — Z1231 Encounter for screening mammogram for malignant neoplasm of breast: Secondary | ICD-10-CM

## 2021-04-20 ENCOUNTER — Ambulatory Visit: Payer: Self-pay | Admitting: *Deleted

## 2021-04-20 ENCOUNTER — Other Ambulatory Visit: Payer: Self-pay

## 2021-04-20 ENCOUNTER — Ambulatory Visit
Admission: RE | Admit: 2021-04-20 | Discharge: 2021-04-20 | Disposition: A | Discharge status: Home / Self Care | Payer: No Typology Code available for payment source | Source: Ambulatory Visit | Attending: Obstetrics and Gynecology | Admitting: Obstetrics and Gynecology

## 2021-04-20 VITALS — BP 124/76 | Wt 172.3 lb

## 2021-04-20 DIAGNOSIS — Z1239 Encounter for other screening for malignant neoplasm of breast: Secondary | ICD-10-CM

## 2021-04-20 DIAGNOSIS — Z1231 Encounter for screening mammogram for malignant neoplasm of breast: Secondary | ICD-10-CM

## 2021-04-20 NOTE — Progress Notes (Signed)
[name] is a 40 y.o. female who presents to [facility] clinic today with no complaints.    Pap Smear: Pap smear not completed today. Last Pap smear was 08/16/2020 at [facility] and was normal. Per patient has no history of an abnormal Pap smear. Last Pap smear result is available in Epic.   Physical exam: Breasts Right breast slightly larger than left. No skin abnormalities bilateral breasts. No nipple retraction bilateral breasts. No nipple discharge bilateral breasts. No lymphadenopathy. No lumps palpated bilateral breasts. No complaints of pain or tenderness on exam. Patient has breast reduction surgery scheduled 05/15/2021.       Pelvic/Bimanual Pap is not indicated today per BCCCP guidelines.   Smoking History: Patient is a former smoker that quit in 2000.   Patient Navigation: Patient education provided. Access to services provided for patient through BCCCP program.    Breast and Cervical Cancer Risk Assessment: Patient does not have family history of breast cancer, known genetic mutations, or radiation treatment to the chest before age 28. Patient does not have history of cervical dysplasia, immunocompromised, or DES exposure in-utero.  Risk Assessment     Risk Scores       04/20/2021   Last edited by: Demetrius Revel, LPN   5-year risk: 0.4 %   Lifetime risk: 7.3 %            A: BCCCP exam without pap smear No complaints.  P: Referred patient to the [facility] for a screening mammogram on mobile unit. Appointment scheduled Thursday, April 20, 2021 at 0920.  Loletta Parish, RN 04/20/2021 8:26 AM

## 2021-04-20 NOTE — Patient Instructions (Signed)
Explained breast self awareness with Nevin Bloodgood. Patient did not need a Pap smear today due to last Pap smear was 08/16/2020. Let her know [facility] will cover Pap smears every 3 years unless has a history of abnormal Pap smears. Referred patient to the [facility] for a screening mammogram on mobile unit. Appointment scheduled Thursday, April 20, 2021 at 0920. Patient escorted to the mobile unit following [facility] appointment for her screening mammogram. Let [facility] know [facility] will follow up with her within the next couple weeks with results of her mammogram by letter or phone. Nevin Bloodgood verbalized understanding.  Crissie Aloi, Arvil Chaco, RN 8:26 AM

## 2021-04-24 ENCOUNTER — Other Ambulatory Visit: Payer: Self-pay | Admitting: Obstetrics and Gynecology

## 2021-04-24 DIAGNOSIS — R928 Other abnormal and inconclusive findings on diagnostic imaging of breast: Secondary | ICD-10-CM

## 2021-05-16 ENCOUNTER — Other Ambulatory Visit: Payer: No Typology Code available for payment source
